# Patient Record
Sex: Male | Born: 1995 | Race: White | Hispanic: No | Marital: Single | Smoking: Never smoker
Health system: Southern US, Community
[De-identification: ages and names within clinical notes are randomized; demographics above are authoritative.]

## PROBLEM LIST (undated history)

## (undated) DIAGNOSIS — Z801 Family history of malignant neoplasm of trachea, bronchus and lung: Secondary | ICD-10-CM

## (undated) DIAGNOSIS — L732 Hidradenitis suppurativa: Secondary | ICD-10-CM

## (undated) DIAGNOSIS — F329 Major depressive disorder, single episode, unspecified: Secondary | ICD-10-CM

## (undated) DIAGNOSIS — Z8042 Family history of malignant neoplasm of prostate: Secondary | ICD-10-CM

## (undated) DIAGNOSIS — Z8041 Family history of malignant neoplasm of ovary: Secondary | ICD-10-CM

## (undated) DIAGNOSIS — Z800 Family history of malignant neoplasm of digestive organs: Secondary | ICD-10-CM

## (undated) DIAGNOSIS — F32A Depression, unspecified: Secondary | ICD-10-CM

## (undated) DIAGNOSIS — Z803 Family history of malignant neoplasm of breast: Secondary | ICD-10-CM

## (undated) DIAGNOSIS — B019 Varicella without complication: Secondary | ICD-10-CM

## (undated) HISTORY — DX: Major depressive disorder, single episode, unspecified: F32.9

## (undated) HISTORY — DX: Family history of malignant neoplasm of digestive organs: Z80.0

## (undated) HISTORY — DX: Family history of malignant neoplasm of ovary: Z80.41

## (undated) HISTORY — DX: Depression, unspecified: F32.A

## (undated) HISTORY — DX: Varicella without complication: B01.9

## (undated) HISTORY — DX: Hidradenitis suppurativa: L73.2

## (undated) HISTORY — DX: Family history of malignant neoplasm of trachea, bronchus and lung: Z80.1

## (undated) HISTORY — DX: Family history of malignant neoplasm of breast: Z80.3

## (undated) HISTORY — DX: Family history of malignant neoplasm of prostate: Z80.42

---
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
## 2015-01-03 ENCOUNTER — Ambulatory Visit (INDEPENDENT_AMBULATORY_CARE_PROVIDER_SITE_OTHER): Payer: Self-pay | Admitting: Family Medicine

## 2015-01-03 ENCOUNTER — Encounter: Payer: Self-pay | Admitting: Family Medicine

## 2015-01-03 VITALS — BP 108/58 | HR 69 | Temp 98.9°F | Ht 72.0 in | Wt 301.0 lb

## 2015-01-03 DIAGNOSIS — H109 Unspecified conjunctivitis: Secondary | ICD-10-CM

## 2015-01-03 DIAGNOSIS — R739 Hyperglycemia, unspecified: Secondary | ICD-10-CM

## 2015-01-03 DIAGNOSIS — R42 Dizziness and giddiness: Secondary | ICD-10-CM

## 2015-01-03 LAB — CBC WITH DIFFERENTIAL/PLATELET
BASOS PCT: 0.2 % (ref 0.0–3.0)
Basophils Absolute: 0 10*3/uL (ref 0.0–0.1)
Eosinophils Absolute: 0.1 10*3/uL (ref 0.0–0.7)
Eosinophils Relative: 1.1 % (ref 0.0–5.0)
HEMATOCRIT: 46.3 % (ref 36.0–49.0)
HEMOGLOBIN: 15.8 g/dL (ref 12.0–16.0)
LYMPHS PCT: 24.5 % (ref 24.0–48.0)
Lymphs Abs: 2.7 10*3/uL (ref 0.7–4.0)
MCHC: 34.1 g/dL (ref 31.0–37.0)
MCV: 86.9 fl (ref 78.0–98.0)
MONOS PCT: 6.9 % (ref 3.0–12.0)
Monocytes Absolute: 0.8 10*3/uL (ref 0.1–1.0)
NEUTROS ABS: 7.5 10*3/uL (ref 1.4–7.7)
Neutrophils Relative %: 67.3 % (ref 43.0–71.0)
Platelets: 235 10*3/uL (ref 150.0–575.0)
RBC: 5.33 Mil/uL (ref 3.80–5.70)
RDW: 13.4 % (ref 11.4–15.5)
WBC: 11.1 10*3/uL (ref 4.5–13.5)

## 2015-01-03 LAB — BASIC METABOLIC PANEL
BUN: 13 mg/dL (ref 6–23)
CHLORIDE: 107 meq/L (ref 96–112)
CO2: 28 mEq/L (ref 19–32)
CREATININE: 0.89 mg/dL (ref 0.40–1.50)
Calcium: 9.3 mg/dL (ref 8.4–10.5)
GFR: 116 mL/min (ref >60.00)
GLUCOSE: 74 mg/dL (ref 70–99)
POTASSIUM: 4.3 meq/L (ref 3.5–5.1)
SODIUM: 140 meq/L (ref 135–145)

## 2015-01-03 LAB — HEPATIC FUNCTION PANEL
ALK PHOS: 81 U/L (ref 52–171)
ALT: 25 U/L (ref 0–53)
AST: 17 U/L (ref 0–37)
Albumin: 4 g/dL (ref 3.5–5.2)
BILIRUBIN DIRECT: 0.1 mg/dL (ref 0.0–0.3)
BILIRUBIN TOTAL: 0.5 mg/dL (ref 0.2–1.2)
Total Protein: 6.7 g/dL (ref 6.0–8.3)

## 2015-01-03 LAB — TSH: TSH: 1.78 u[IU]/mL (ref 0.40–5.00)

## 2015-01-03 LAB — HEMOGLOBIN A1C: Hgb A1c MFr Bld: 5.5 % (ref 4.6–6.5)

## 2015-01-03 NOTE — Progress Notes (Signed)
   Subjective:    Patient ID: Ricardo [name], male    DOB: 1996/04/25, 19 y.o.   MRN: [id number]  HPI 19 yr old male to re-establish and for several issues. First he had a spell at home 4 days ago when he suddenly felt lightheaded, sweaty, weak, and nauseated. No chest pain or SOB. He sat down and ate some food, and within 5 minutes he felt much better. He has had no spells like this ever since. Also he describes several weeks of irritation and burning in both eyes. No DC. No blurred vision. He does not wear contacts.    Review of Systems  Constitutional: Positive for diaphoresis. Negative for fever and fatigue.  Eyes: Positive for redness and itching. Negative for photophobia, discharge and visual disturbance.  Respiratory: Negative.   Cardiovascular: Negative.   Neurological: Positive for weakness.       Objective:   Physical Exam  Constitutional: He is oriented to person, place, and time. He appears well-developed and well-nourished.  HENT:  Right Ear: External ear normal.  Left Ear: External ear normal.  Nose: Nose normal.  Mouth/Throat: Oropharynx is clear and moist.  Eyes: Conjunctivae and EOM are normal. Pupils are equal, round, and reactive to light. Right eye exhibits no discharge. Left eye exhibits no discharge.  Cardiovascular: Normal rate, regular rhythm, normal heart sounds and intact distal pulses.   Pulmonary/Chest: Effort normal and breath sounds normal.  Neurological: He is alert and oriented to person, place, and time. He has normal reflexes. No cranial nerve deficit. He exhibits normal muscle tone. Coordination normal.          Assessment & Plan:  He is having some allergy issues in the eyes. He ill try Artificial Tears prn. Also he had a spell which sounds like hypoglycemia. We will screen with labs today. He is advised to eat healthy snacks throughout the day and to avoid skipping meals.

## 2015-01-03 NOTE — Progress Notes (Signed)
Pre visit review using our clinic review tool, if applicable. No additional management support is needed unless otherwise documented below in the visit note. 

## 2015-11-17 ENCOUNTER — Telehealth: Payer: Self-pay | Admitting: Family Medicine

## 2015-11-17 ENCOUNTER — Ambulatory Visit: Payer: Self-pay | Admitting: Family Medicine

## 2015-11-17 NOTE — Telephone Encounter (Signed)
Pt was on schedule to see Dr. Sarajane Jews, tried to reach pt by phone and no answer.

## 2016-01-10 ENCOUNTER — Encounter: Payer: Self-pay | Admitting: Family Medicine

## 2016-01-10 ENCOUNTER — Ambulatory Visit (INDEPENDENT_AMBULATORY_CARE_PROVIDER_SITE_OTHER): Payer: Self-pay | Admitting: Family Medicine

## 2016-01-10 VITALS — BP 112/68 | HR 98 | Temp 98.7°F | Ht 72.0 in | Wt 327.0 lb

## 2016-01-10 DIAGNOSIS — L732 Hidradenitis suppurativa: Secondary | ICD-10-CM

## 2016-01-10 DIAGNOSIS — K625 Hemorrhage of anus and rectum: Secondary | ICD-10-CM

## 2016-01-10 DIAGNOSIS — R1013 Epigastric pain: Secondary | ICD-10-CM

## 2016-01-10 MED ORDER — OMEPRAZOLE 40 MG PO CPDR: 40.0000 mg | DELAYED_RELEASE_CAPSULE | Freq: Every day | ORAL | Status: DC

## 2016-01-10 MED ORDER — DOXYCYCLINE HYCLATE 100 MG PO CAPS: 100.0000 mg | ORAL_CAPSULE | Freq: Two times a day (BID) | ORAL | Status: AC

## 2016-01-10 NOTE — Progress Notes (Signed)
Pre visit review using our clinic review tool, if applicable. No additional management support is needed unless otherwise documented below in the visit note. 

## 2016-01-10 NOTE — Progress Notes (Signed)
   Subjective:    Patient ID: Ricardo [name], male    DOB: 1996-10-20, 20 y.o.   MRN: [id number]  HPI Here for several issues. 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 No fevers. These open and drain and they are painful. Also he has had 4 episodes of bright red blood in the stool with a BM over the past 4 weeks. These stools are soft and are easy to pass. His stools are never painful. Also he has had an intermittent dull aching pain in the epigastrium for the past few weeks. No heartburn. No nausea. His appetite is normal.    Review of Systems  Constitutional: Negative.   Respiratory: Negative.   Cardiovascular: Negative.   Gastrointestinal: Positive for abdominal pain and blood in stool. Negative for nausea, vomiting, diarrhea, constipation, abdominal distention and rectal pain.  Skin: Positive for wound.       Objective:   Physical Exam  Constitutional: He appears well-developed and well-nourished.  Cardiovascular: Normal rate, regular rhythm, normal heart sounds and intact distal pulses.   Pulmonary/Chest: Effort normal and breath sounds normal.  Abdominal: Soft. Bowel sounds are normal. He exhibits no distension and no mass. There is no tenderness. There is no rebound and no guarding.  Genitourinary: Rectum normal.  No evidence of hemorrhoids or fissures           Assessment & Plan:  Treat the hydradenitis with Doxycycline for 30 days. He may have a duodenal ulcer, given Omeprazole to try. The source of rectal bleeding is unclear. We will refer him to GI.

## 2016-09-17 ENCOUNTER — Ambulatory Visit (INDEPENDENT_AMBULATORY_CARE_PROVIDER_SITE_OTHER): Payer: Self-pay | Admitting: Family Medicine

## 2016-09-17 ENCOUNTER — Encounter: Payer: Self-pay | Admitting: Family Medicine

## 2016-09-17 VITALS — BP 118/70 | HR 76 | Temp 98.7°F | Resp 16 | Ht 72.0 in | Wt 309.0 lb

## 2016-09-17 DIAGNOSIS — H109 Unspecified conjunctivitis: Secondary | ICD-10-CM

## 2016-09-17 MED ORDER — POLYMYXIN B-TRIMETHOPRIM 10000-0.1 UNIT/ML-% OP SOLN: 1.0000 [drp] | Freq: Four times a day (QID) | OPHTHALMIC | 0 refills | Status: DC

## 2016-09-17 NOTE — Progress Notes (Signed)
Subjective:    Patient ID: Ricardo [name], male    DOB: 1996-05-14, 20 y.o.   MRN: [id number]  HPI  Mr. Thole is a 20 year old male who presents today with right eye tearing and redness that started one week ago.  Associated tearing and  crusting is present in the morning and occurs throughout the day. Crusting is noted as yellow/brown in color.  No history of sick contact or recent antibiotic use. He wears glasses and does not wear contacts. He denies fever, chills, sweats, or vision changes. Treatment at home includes OTC redness relief drops that has provided limited benefit. No aggravating or alleviating factors are noted.  Review of Systems  Constitutional: Negative for chills, fatigue and fever.  HENT: Negative for congestion, postnasal drip, rhinorrhea, sinus pressure, sneezing and sore throat.   Eyes: Positive for discharge and redness. Negative for visual disturbance.  Respiratory: Negative for cough and wheezing.   Cardiovascular: Negative for chest pain and palpitations.  Gastrointestinal: Negative for abdominal pain, constipation, diarrhea, nausea and vomiting.   Past Medical History:  Diagnosis Date  . Chicken pox   . Depression   . Hydradenitis    suppurativa     Social History   Social History  . Marital status: Single    Spouse name: N/A  . Number of children: N/A  . Years of education: N/A   Occupational History  . Not on file.   Social History Main Topics  . Smoking status: Never Smoker  . Smokeless tobacco: Never Used  . Alcohol use No  . Drug use:     Types: Marijuana     Comment: every day  . Sexual activity: No   Other Topics Concern  . Not on file   Social History Narrative  . No narrative on file    History reviewed. No pertinent surgical history.  History reviewed. No pertinent family history.  Allergies  Allergen Reactions  . Sulfonamide Derivatives     Current Outpatient Prescriptions on File Prior to Visit  Medication  Sig Dispense Refill  . omeprazole (PRILOSEC) 40 MG capsule Take 1 capsule (40 mg total) by mouth daily. 30 capsule 5   No current facility-administered medications on file prior to visit.     BP 118/70 (BP Location: Left Arm, Patient Position: Sitting, Cuff Size: Normal)   Pulse 76   Temp 98.7 F (37.1 C) (Oral)   Resp 16   Ht 6' (1.829 m)   Wt (!) 309 lb (140.2 kg)   SpO2 98%   BMI 41.91 kg/m        Objective:   Physical Exam  Constitutional: He appears well-developed and well-nourished.  HENT:  Right Ear: Tympanic membrane normal.  Left Ear: Tympanic membrane normal.  Nose: No rhinorrhea. Right sinus exhibits no maxillary sinus tenderness and no frontal sinus tenderness. Left sinus exhibits no maxillary sinus tenderness and no frontal sinus tenderness.  Mouth/Throat: Mucous membranes are normal. No oropharyngeal exudate or posterior oropharyngeal erythema.  Eyes: Pupils are equal, round, and reactive to light. Left eye exhibits discharge. No scleral icterus.  Diffuse conjunctival redness noted in left eye.  No visual changes present. Patient wears glasses and does not wear contact lenses. No evidence of foreign body present, hordeolum,or blepharitis present.  Cardiovascular: Normal rate and regular rhythm.   Pulmonary/Chest: Effort normal and breath sounds normal. He has no wheezes. He has no rales.        Assessment & Plan:  1. Bacterial  conjunctivitis of left eye Exam and history support treatment for bacterial conjunctivitis.  Polytrim provided and advised patient to use a cool, clean washcloth for 10-20 minutes TID. Advised proper handwashing discussed that this can can be spread so avoid sharing towels/washcloths. - trimethoprim-polymyxin b (POLYTRIM) ophthalmic solution; Place 1 drop into the left eye every 6 (six) hours.  Dispense: 10 mL; Refill: 0  Delano Metz, FNP-C

## 2016-09-17 NOTE — Patient Instructions (Signed)
Please use drops as directed and follow up if symptoms do not improve, suddenly worsen, or you develop changes in your visit. See information below.   Bacterial Conjunctivitis Bacterial conjunctivitis, commonly called pink eye, is an inflammation of the clear membrane that covers the white part of the eye (conjunctiva). The inflammation can also happen on the underside of the eyelids. The blood vessels in the conjunctiva become inflamed, causing the eye to become red or pink. Bacterial conjunctivitis may spread easily from one eye to another and from person to person (contagious).  CAUSES  Bacterial conjunctivitis is caused by bacteria. The bacteria may come from your own skin, your upper respiratory tract, or from someone else with bacterial conjunctivitis. SYMPTOMS  The normally white color of the eye or the underside of the eyelid is usually pink or red. The pink eye is usually associated with irritation, tearing, and some sensitivity to light. Bacterial conjunctivitis is often associated with a thick, yellowish discharge from the eye. The discharge may turn into a crust on the eyelids overnight, which causes your eyelids to stick together. If a discharge is present, there may also be some blurred vision in the affected eye. DIAGNOSIS  Bacterial conjunctivitis is diagnosed by your caregiver through an eye exam and the symptoms that you report. Your caregiver looks for changes in the surface tissues of your eyes, which may point to the specific type of conjunctivitis. A sample of any discharge may be collected on a cotton-tip swab if you have a severe case of conjunctivitis, if your cornea is affected, or if you keep getting repeat infections that do not respond to treatment. The sample will be sent to a lab to see if the inflammation is caused by a bacterial infection and to see if the infection will respond to antibiotic medicines. TREATMENT   Bacterial conjunctivitis is treated with antibiotics.  Antibiotic eyedrops are most often used. However, antibiotic ointments are also available. Antibiotics pills are sometimes used. Artificial tears or eye washes may ease discomfort. HOME CARE INSTRUCTIONS   To ease discomfort, apply a cool, clean washcloth to your eye for 10-20 minutes, 3-4 times a day.  Gently wipe away any drainage from your eye with a warm, wet washcloth or a cotton ball.  Wash your hands often with soap and water. Use paper towels to dry your hands.  Do not share towels or washcloths. This may spread the infection.  Change or wash your pillowcase every day.  You should not use eye makeup until the infection is gone.  Do not operate machinery or drive if your vision is blurred.  Stop using contact lenses. Ask your caregiver how to sterilize or replace your contacts before using them again. This depends on the type of contact lenses that you use.  When applying medicine to the infected eye, do not touch the edge of your eyelid with the eyedrop bottle or ointment tube. SEEK IMMEDIATE MEDICAL CARE IF:   Your infection has not improved within 3 days after beginning treatment.  You had yellow discharge from your eye and it returns.  You have increased eye pain.  Your eye redness is spreading.  Your vision becomes blurred.  You have a fever or persistent symptoms for more than 2-3 days.  You have a fever and your symptoms suddenly get worse.  You have facial pain, redness, or swelling. MAKE SURE YOU:   Understand these instructions.  Will watch your condition.  Will get help right away if you are  not doing well or get worse.   This information is not intended to replace advice given to you by your health care provider. Make sure you discuss any questions you have with your health care provider.   Document Released: 11/19/2005 Document Revised: 12/10/2014 Document Reviewed: 04/21/2012 Elsevier Interactive Patient Education [employer].

## 2016-11-14 ENCOUNTER — Other Ambulatory Visit: Payer: Self-pay | Admitting: Orthopedic Surgery

## 2016-11-14 DIAGNOSIS — M545 Low back pain: Secondary | ICD-10-CM

## 2016-11-14 DIAGNOSIS — M5416 Radiculopathy, lumbar region: Secondary | ICD-10-CM

## 2016-11-30 ENCOUNTER — Ambulatory Visit
Admission: RE | Admit: 2016-11-30 | Discharge: 2016-11-30 | Disposition: A | Discharge status: Home / Self Care | Payer: No Typology Code available for payment source | Source: Ambulatory Visit | Attending: Orthopedic Surgery | Admitting: Orthopedic Surgery

## 2016-11-30 DIAGNOSIS — M545 Low back pain: Secondary | ICD-10-CM

## 2016-11-30 DIAGNOSIS — M5416 Radiculopathy, lumbar region: Secondary | ICD-10-CM

## 2016-12-14 ENCOUNTER — Other Ambulatory Visit: Payer: Self-pay | Admitting: Physical Medicine and Rehabilitation

## 2016-12-14 DIAGNOSIS — M545 Low back pain: Secondary | ICD-10-CM

## 2016-12-14 DIAGNOSIS — M79605 Pain in left leg: Secondary | ICD-10-CM

## 2016-12-24 ENCOUNTER — Ambulatory Visit
Admission: RE | Admit: 2016-12-24 | Discharge: 2016-12-24 | Disposition: A | Discharge status: Home / Self Care | Payer: No Typology Code available for payment source | Source: Ambulatory Visit | Attending: Physical Medicine and Rehabilitation | Admitting: Physical Medicine and Rehabilitation

## 2016-12-24 DIAGNOSIS — M79605 Pain in left leg: Secondary | ICD-10-CM

## 2016-12-24 DIAGNOSIS — M545 Low back pain: Secondary | ICD-10-CM

## 2016-12-24 MED ORDER — IOPAMIDOL (ISOVUE-M 200) INJECTION 41%
1.0000 mL | Freq: Once | INTRAMUSCULAR | Status: AC
  Administered 2016-12-24: 1 mL via EPIDURAL

## 2016-12-24 MED ORDER — METHYLPREDNISOLONE ACETATE 40 MG/ML INJ SUSP (RADIOLOG
120.0000 mg | Freq: Once | INTRAMUSCULAR | Status: AC
  Administered 2016-12-24: 120 mg via EPIDURAL

## 2016-12-24 NOTE — Discharge Instructions (Signed)

## 2017-02-05 ENCOUNTER — Other Ambulatory Visit: Payer: Self-pay | Admitting: Family Medicine

## 2017-02-05 DIAGNOSIS — H109 Unspecified conjunctivitis: Secondary | ICD-10-CM

## 2017-05-31 ENCOUNTER — Encounter: Payer: Self-pay | Admitting: Family Medicine

## 2017-05-31 ENCOUNTER — Ambulatory Visit (INDEPENDENT_AMBULATORY_CARE_PROVIDER_SITE_OTHER): Payer: Self-pay | Admitting: Family Medicine

## 2017-05-31 VITALS — BP 110/58 | HR 51 | Temp 97.5°F | Ht 72.0 in | Wt 302.2 lb

## 2017-05-31 DIAGNOSIS — H6122 Impacted cerumen, left ear: Secondary | ICD-10-CM

## 2017-05-31 NOTE — Progress Notes (Signed)
  HPI:  Acute visit for "clogged ear": -started a few days ago -clogged sensation L ear, decreased hearing and occasional popping sound -denies pain, fevers, malaise, sinus congestion  ROS: See pertinent positives and negatives per HPI.  Past Medical History:  Diagnosis Date  . Chicken pox   . Depression   . Hydradenitis    suppurativa    No past surgical history on file.  No family history on file.  Social History   Social History  . Marital status: Single    Spouse name: N/A  . Number of children: N/A  . Years of education: N/A   Social History Main Topics  . Smoking status: Never Smoker  . Smokeless tobacco: Never Used  . Alcohol use No  . Drug use: Yes    Types: Marijuana     Comment: every day  . Sexual activity: No   Other Topics Concern  . None   Social History Narrative  . None    No current outpatient prescriptions on file.  EXAM:  Vitals:   05/31/17 1625  BP: (!) 110/58  Pulse: (!) 51  Temp: 97.5 F (36.4 C)    Body mass index is 40.99 kg/m.  GENERAL: vitals reviewed and listed above, alert, oriented, appears well hydrated and in no acute distress  HEENT: atraumatic, conjunttiva clear, no obvious abnormalities on inspection of external nose and ears, cerumen impaction with complete occlusion of ear canal L, erytheama scaling and plaques of skin on both ears  SKIN: scaly plaques with erythematous base bilat elbows  NECK: no obvious masses on inspection  LUNGS: clear to auscultation bilaterally, no wheezes, rales or rhonchi, good air movement  CV: HRRR, no peripheral edema  MS: moves all extremities without noticeable abnormality  PSYCH: pleasant and cooperative, no obvious depression or anxiety  ASSESSMENT AND PLAN:  Discussed the following assessment and plan:  Impacted cerumen of left ear -discussed options for management - he opted for manual removal - large amount of cerumen removed from L ear canal with soft curette - some  impacted wax remained on TM and he opted to try home ear cleaning drops for this and follow up for recheck in 1 week -he also reports extensive psoriasis and agrees to discuss with PCP on follow up -Patient advised to recheck sooner if symptoms worsen or new concerns arise.  Patient Instructions  BEFORE YOU LEAVE: -follow up: in 1 week with Dr. Sarajane Jews  Ear cleaning drops once daily - debrox is one options  Follow up sooner if pain, worsening or new symptoms    Whittley Carandang, Jarrett Soho R., DO

## 2017-05-31 NOTE — Patient Instructions (Signed)
BEFORE YOU LEAVE: -follow up: in 1 week with Dr. Sarajane Jews  Ear cleaning drops once daily - debrox is one options  Follow up sooner if pain, worsening or new symptoms

## 2017-06-07 ENCOUNTER — Encounter: Payer: Self-pay | Admitting: Family Medicine

## 2017-06-07 ENCOUNTER — Ambulatory Visit (INDEPENDENT_AMBULATORY_CARE_PROVIDER_SITE_OTHER): Payer: Self-pay | Admitting: Family Medicine

## 2017-06-07 VITALS — BP 110/74 | Temp 98.2°F | Ht 72.0 in | Wt 305.0 lb

## 2017-06-07 DIAGNOSIS — L400 Psoriasis vulgaris: Secondary | ICD-10-CM

## 2017-06-07 DIAGNOSIS — H6122 Impacted cerumen, left ear: Secondary | ICD-10-CM

## 2017-06-07 NOTE — Patient Instructions (Signed)
WE NOW OFFER   Ricardo [name] FAST TRACK!!!  SAME DAY Appointments for ACUTE CARE  Such as: Sprains, Injuries, cuts, abrasions, rashes, muscle pain, joint pain, back pain Colds, flu, sore throats, headache, allergies, cough, fever  Ear pain, sinus and eye infections Abdominal pain, nausea, vomiting, diarrhea, upset stomach Animal/insect bites  3 Easy Ways to Schedule: Walk-In Scheduling Call in scheduling Mychart Sign-up: [email or web address]         

## 2017-06-07 NOTE — Progress Notes (Signed)
   Subjective:    Patient ID: Ricardo [name], male    DOB: 1996-11-04, 21 y.o.   MRN: [id number]  HPI Here for 2 issues. First he has had decreased hearing and pressure in the left ear for several weeks. No pain. He was seen here last week and some cerumen was removed with a curette. Also his psoriasis is getting worse and topical preparations like Clobetasol no longer help.     Review of Systems  Constitutional: Negative.   HENT: Positive for hearing loss. Negative for ear discharge and ear pain.   Respiratory: Negative.   Cardiovascular: Negative.   Musculoskeletal: Negative for arthralgias.  Skin: Positive for rash.       Objective:   Physical Exam  Constitutional: He appears well-developed and well-nourished.  HENT:  Nose: Nose normal.  Mouth/Throat: Oropharynx is clear and moist.  The right ear canal is partially full of cerumen. The left ear canal is completely blocked with cerumen.   Eyes: Conjunctivae are normal.  Neck: No thyromegaly present.  Cardiovascular: Normal rate, regular rhythm, normal heart sounds and intact distal pulses.   Pulmonary/Chest: Effort normal and breath sounds normal.  Lymphadenopathy:    He has no cervical adenopathy.  Skin:  He has extensive psoriatic plaques in both ear lobes, on both elbows, and on both knees.           Assessment & Plan:  The cerumen impactions were irrigated clear with water. We will refer him to Dermatology for the psoriasis.  Alysia Penna, MD

## 2017-06-11 ENCOUNTER — Encounter: Payer: Self-pay | Admitting: Adult Health

## 2017-06-11 ENCOUNTER — Ambulatory Visit (INDEPENDENT_AMBULATORY_CARE_PROVIDER_SITE_OTHER): Payer: Self-pay | Admitting: Adult Health

## 2017-06-11 VITALS — BP 104/60 | HR 88 | Temp 98.3°F | Ht 72.0 in | Wt 305.8 lb

## 2017-06-11 DIAGNOSIS — L400 Psoriasis vulgaris: Secondary | ICD-10-CM

## 2017-06-11 NOTE — Progress Notes (Signed)
   Subjective:    Patient ID: MIGUELANGEL KORN, male    DOB: 04-Oct-1996, 21 y.o.   MRN: [id number]  HPI  21 year old male who  has a past medical history of Chicken pox; Depression; and Hydradenitis.  He presents to the office today for left ear pain. He recently had his cerumen impaction irrigated by his PCP. The day after he noticed pain in the ear canal and clear drainage. Per patient " I feel like it may be infected."   He denies any fevers, inner ear pain, or feeling ill.   Review of Systems See HPI   Past Medical History:  Diagnosis Date  . Chicken pox   . Depression   . Hydradenitis    suppurativa    Social History   Social History  . Marital status: Single    Spouse name: N/A  . Number of children: N/A  . Years of education: N/A   Occupational History  . Not on file.   Social History Main Topics  . Smoking status: Never Smoker  . Smokeless tobacco: Never Used  . Alcohol use No  . Drug use: Yes    Types: Marijuana     Comment: every day  . Sexual activity: No   Other Topics Concern  . Not on file   Social History Narrative  . No narrative on file    No past surgical history on file.  No family history on file.  Allergies  Allergen Reactions  . Sulfonamide Derivatives     No current outpatient prescriptions on file prior to visit.   No current facility-administered medications on file prior to visit.     BP 104/60   Pulse 88   Temp 98.3 F (36.8 C) (Oral)   Ht 6' (1.829 m)   Wt (!) 305 lb 12.8 oz (138.7 kg)   BMI 41.47 kg/m       Objective:   Physical Exam  Constitutional: He is oriented to person, place, and time. He appears well-developed and well-nourished. No distress.  HENT:  Head: Normocephalic and atraumatic.  Nose: Nose normal.  Mouth/Throat: Oropharynx is clear and moist. No oropharyngeal exudate.  Neurological: He is alert and oriented to person, place, and time.  Skin: Skin is warm and dry. Rash (psorias ) noted. He  is not diaphoretic.  Nursing note and vitals reviewed.     Assessment & Plan:  1. Plaque psoriasis - Advised lotion and/or cortisone cream to the area until seen by dermatology  - No signs of infection  - Follow up with PCP as needed  Dorothyann Peng, NP

## 2017-07-23 ENCOUNTER — Ambulatory Visit (INDEPENDENT_AMBULATORY_CARE_PROVIDER_SITE_OTHER): Payer: Self-pay | Admitting: Family Medicine

## 2017-07-23 ENCOUNTER — Encounter: Payer: Self-pay | Admitting: Family Medicine

## 2017-07-23 VITALS — BP 126/70 | Temp 98.1°F | Ht 72.0 in | Wt 321.0 lb

## 2017-07-23 DIAGNOSIS — H1033 Unspecified acute conjunctivitis, bilateral: Secondary | ICD-10-CM

## 2017-07-23 MED ORDER — TOBRAMYCIN-DEXAMETHASONE 0.3-0.1 % OP SUSP: 2.0000 [drp] | OPHTHALMIC | 0 refills | Status: DC

## 2017-07-23 NOTE — Progress Notes (Signed)
   Subjective:    Patient ID: Ricardo [name], male    DOB: 05/02/1996, 21 y.o.   MRN: [id number]  HPI  here for 2 days of irritation and mucus in both eyes. No URI symptoms. He does not wear contacts.    Review of Systems  Constitutional: Negative.   HENT: Negative for congestion, ear pain, facial swelling, postnasal drip, rhinorrhea, sinus pain and sinus pressure.   Eyes: Positive for discharge, redness and itching. Negative for visual disturbance.  Respiratory: Negative.        Objective:   Physical Exam  Constitutional: He appears well-developed and well-nourished.  HENT:  Right Ear: External ear normal.  Left Ear: External ear normal.  Nose: Nose normal.  Mouth/Throat: Oropharynx is clear and moist.  Eyes: Pupils are equal, round, and reactive to light.  Both conjunctivae are pink, the left eye has some yellow DC in the corners  Neck: Neck supple. No thyromegaly present.  Pulmonary/Chest: Effort normal and breath sounds normal. No respiratory distress. He has no wheezes. He has no rales.  Lymphadenopathy:    He has no cervical adenopathy.          Assessment & Plan:  Conjunctivitis, treat with warm compresses and Tobradex drops.  Alysia Penna, MD

## 2017-07-23 NOTE — Patient Instructions (Signed)
WE NOW OFFER   [facility] FAST TRACK!!!  SAME DAY Appointments for ACUTE CARE  Such as: Sprains, Injuries, cuts, abrasions, rashes, muscle pain, joint pain, back pain Colds, flu, sore throats, headache, allergies, cough, fever  Ear pain, sinus and eye infections Abdominal pain, nausea, vomiting, diarrhea, upset stomach Animal/insect bites  3 Easy Ways to Schedule: Walk-In Scheduling Call in scheduling Mychart Sign-up: [email or web address]         

## 2017-08-12 ENCOUNTER — Ambulatory Visit (INDEPENDENT_AMBULATORY_CARE_PROVIDER_SITE_OTHER): Payer: Self-pay | Admitting: Family Medicine

## 2017-08-12 ENCOUNTER — Encounter: Payer: Self-pay | Admitting: Family Medicine

## 2017-08-12 VITALS — HR 75 | Temp 98.2°F | Ht 72.0 in | Wt 328.0 lb

## 2017-08-12 DIAGNOSIS — F418 Other specified anxiety disorders: Secondary | ICD-10-CM | POA: Insufficient documentation

## 2017-08-12 MED ORDER — FLUOXETINE HCL 20 MG PO TABS: 20.0000 mg | ORAL_TABLET | Freq: Every day | ORAL | 3 refills | Status: DC

## 2017-08-12 NOTE — Patient Instructions (Signed)
WE NOW OFFER   [facility] FAST TRACK!!!  SAME DAY Appointments for ACUTE CARE  Such as: Sprains, Injuries, cuts, abrasions, rashes, muscle pain, joint pain, back pain Colds, flu, sore throats, headache, allergies, cough, fever  Ear pain, sinus and eye infections Abdominal pain, nausea, vomiting, diarrhea, upset stomach Animal/insect bites  3 Easy Ways to Schedule: Walk-In Scheduling Call in scheduling Mychart Sign-up: [email or web address]         

## 2017-08-12 NOTE — Progress Notes (Signed)
   Subjective:    Patient ID: Ricardo [name], male    DOB: July 13, 1996, 21 y.o.   MRN: [id number]  HPI Here to discuss depression and anxiety. He has struggled with this off and on for years. He briefly took Prozac and did well as a teenager. Now for the past month he has felt sad, powerless, angry, anxious, and has had some suicidal thoughts. He has no plan however and he does not intend to hurt himself in any way. Is appetite is down and he sleeps more than usual. He used to enjoy swimming for exercise 3 days a week but lately he is too tired to go.    Review of Systems  Constitutional: Negative.   Respiratory: Negative.   Cardiovascular: Negative.   Neurological: Negative.   Psychiatric/Behavioral: Positive for decreased concentration, dysphoric mood, sleep disturbance and suicidal ideas. Negative for agitation, behavioral problems, confusion, hallucinations and self-injury. The patient is nervous/anxious. The patient is not hyperactive.        Objective:   Physical Exam  Constitutional: He is oriented to person, place, and time. He appears well-developed and well-nourished.  Cardiovascular: Normal rate, regular rhythm, normal heart sounds and intact distal pulses.   Pulmonary/Chest: Effort normal and breath sounds normal. No respiratory distress. He has no wheezes. He has no rales.  Musculoskeletal:  His affect is depressed, eye contact is poor   Lymphadenopathy:    He has no cervical adenopathy.  Neurological: He is alert and oriented to person, place, and time.          Assessment & Plan:  He has depression with anxiety, so we will treat with Prozac 20 mg daily. I urged him to get back into swimming for exercise. Recheck in 3 weeks. We spent a total of 45 minutes face to face discussing the treatment of depressoin. Alysia Penna, MD

## 2017-11-15 ENCOUNTER — Other Ambulatory Visit: Payer: Self-pay

## 2017-11-15 MED ORDER — FLUOXETINE HCL 20 MG PO TABS
20.0000 mg | ORAL_TABLET | Freq: Every day | ORAL | 0 refills | Status: DC
Start: 2017-11-15 — End: 2018-01-31

## 2018-01-21 ENCOUNTER — Ambulatory Visit: Payer: Self-pay | Admitting: *Deleted

## 2018-01-21 NOTE — Telephone Encounter (Signed)
Pt reports "abscess" at left groin area. Noted 1 week ago, "has been growing."  States area at left side of groin "in crease,more towards buttocks."  Reports area is large "I can hold it in my hand." Red, warm, area is hard, 10/10 pain "to touch."  States small amt of bleeding this am but no open areas, "weeping." Denies fever, any other symptoms. States he has had smaller "abscesses" in past that he "took care of by draining them myself." Pt has H/O Hidradenitis Suppurativa. Pt also states he has an appt with a surgeon "about 9 days from now."  Reports this was arranged by his father; surgeon has not seen patient, pt does not know surgeons name. Directed to UC. Care advice given per protocol. Reason for Disposition . [1] Swelling is red AND [2] size > 2 inches (5.0 cm) (Exception: itchy area of skin)  Answer Assessment - Initial Assessment Questions 1. APPEARANCE of SWELLING: "What does it look like?" (e.g., lymph node, insect bite, mole)     Large (size of fist) "Can hold in hand." 2. SIZE: "How large is the swelling?" (inches, cm or compare to coins)     Can hold in hand 3. LOCATION: "Where is the swelling located?"     Left groin area, at crease, towards buttocks 4. ONSET: "When did the swelling start?"     1 week ago 5. PAIN: "Is it painful?" If so, ask: "How much?"     10/10 to touch 6. ITCH: "Does it itch?" If so, ask: "How much?"    No 7. CAUSE: "What do you think caused the swelling?"     Have had in past but not as large.  Treated himself by draining. 8. OTHER SYMPTOMS: "Do you have any other symptoms?" (e.g., fever)     Small amount of bleeding last night, no open areas, "weeping"  Protocols used: SKIN LUMP OR LOCALIZED SWELLING-A-AH

## 2018-01-23 ENCOUNTER — Other Ambulatory Visit: Payer: Self-pay | Admitting: Orthopedic Surgery

## 2018-01-23 DIAGNOSIS — M5416 Radiculopathy, lumbar region: Secondary | ICD-10-CM

## 2018-01-31 ENCOUNTER — Other Ambulatory Visit: Payer: Self-pay | Admitting: *Deleted

## 2018-01-31 MED ORDER — FLUOXETINE HCL 20 MG PO TABS: 20.0000 mg | ORAL_TABLET | Freq: Every day | ORAL | 0 refills | Status: DC

## 2018-01-31 NOTE — Telephone Encounter (Signed)
Rx done. 

## 2018-02-16 ENCOUNTER — Ambulatory Visit
Admission: RE | Admit: 2018-02-16 | Discharge: 2018-02-16 | Disposition: A | Discharge status: Home / Self Care | Payer: BLUE CROSS/BLUE SHIELD | Source: Ambulatory Visit | Attending: Orthopedic Surgery | Admitting: Orthopedic Surgery

## 2018-02-16 DIAGNOSIS — M5416 Radiculopathy, lumbar region: Secondary | ICD-10-CM

## 2018-03-10 ENCOUNTER — Ambulatory Visit: Payer: BLUE CROSS/BLUE SHIELD | Admitting: Family Medicine

## 2018-03-10 ENCOUNTER — Encounter: Payer: Self-pay | Admitting: Family Medicine

## 2018-03-10 VITALS — BP 110/80 | HR 58 | Temp 97.8°F | Ht 72.0 in | Wt 348.4 lb

## 2018-03-10 DIAGNOSIS — M79605 Pain in left leg: Secondary | ICD-10-CM | POA: Insufficient documentation

## 2018-03-10 DIAGNOSIS — M545 Low back pain, unspecified: Secondary | ICD-10-CM

## 2018-03-10 MED ORDER — FLUOXETINE HCL 20 MG PO TABS: 20.0000 mg | ORAL_TABLET | Freq: Every day | ORAL | 3 refills | Status: AC

## 2018-03-10 NOTE — Progress Notes (Signed)
   Subjective:    Patient ID: Ricardo [name], male    DOB: 1996-10-18, 22 y.o.   MRN: [id number]  HPI Here for advice about his spine. He has been dealing with low back pain and left sciatic pain for several years. He has been seeing Dr. Lynann Bologna and Dr. Mina Marble for this. 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 He went to PT then and received an epidural steroid shot. This was very successful and he was not bothered by pain for a good 6 months. Recently the pain has flared back up a bit however. He had another MRI on 02-16-18, and this was not very impressive. The mild disc bulges have not progressed at all in the past 18 months. Now [place] and his father are contemplating surgery, and they ask my opinion. [name] has a very sedentary lifestyle and he continues to slowly gain weight.   Review of Systems  Constitutional: Negative.   Respiratory: Negative.   Cardiovascular: Negative.   Musculoskeletal: Positive for back pain.       Objective:   Physical Exam  Constitutional: He is oriented to person, place, and time.  Morbidly obese   Cardiovascular: Normal rate, regular rhythm, normal heart sounds and intact distal pulses.  Pulmonary/Chest: Effort normal and breath sounds normal. No respiratory distress. He has no wheezes. He has no rales.  Musculoskeletal:  Mildly tender in the lower back on both sides, negative SLR, and full ROM   Neurological: He is alert and oriented to person, place, and time.          Assessment & Plan:  Low back pain. I told Maaz that I advised against a surgery at this time. There are risks to this procedure and there is no guarantee that it will help at all. My advice is to exercise, to strengthen his core, and to lose at least 100 lbs. Doing this would likely get rid of the pain altogether. He will discuss this with his family. We plan to see him back soon for a well exam and  labs. We spent 45 minutes together today. Alysia Penna, MD

## 2018-03-17 ENCOUNTER — Encounter: Payer: BLUE CROSS/BLUE SHIELD | Admitting: Family Medicine

## 2018-03-17 DIAGNOSIS — Z2089 Contact with and (suspected) exposure to other communicable diseases: Secondary | ICD-10-CM

## 2018-06-02 ENCOUNTER — Encounter: Payer: Self-pay | Admitting: Family Medicine

## 2018-06-02 ENCOUNTER — Ambulatory Visit: Payer: BLUE CROSS/BLUE SHIELD | Admitting: Family Medicine

## 2018-06-02 VITALS — BP 120/70 | HR 82 | Temp 98.4°F | Resp 12 | Ht 72.0 in | Wt 356.0 lb

## 2018-06-02 DIAGNOSIS — H9012 Conductive hearing loss, unilateral, left ear, with unrestricted hearing on the contralateral side: Secondary | ICD-10-CM | POA: Diagnosis not present

## 2018-06-02 DIAGNOSIS — H60503 Unspecified acute noninfective otitis externa, bilateral: Secondary | ICD-10-CM

## 2018-06-02 MED ORDER — CIPROFLOXACIN-DEXAMETHASONE 0.3-0.1 % OT SUSP: 4.0000 [drp] | Freq: Two times a day (BID) | OTIC | 0 refills | Status: DC

## 2018-06-02 NOTE — Patient Instructions (Signed)
  [name] I have seen you today for an acute visit.  A few things to remember from today's visit:   Acute otitis externa of both ears, unspecified type - Plan: ciprofloxacin-dexamethasone (CIPRODEX) OTIC suspension   Medications prescribed today are intended for short period of time and will not be refill upon request, a follow up appointment might be necessary to discuss continuation of of treatment if appropriate.     In general please monitor for signs of worsening symptoms and seek immediate medical attention if any concerning.  If symptoms are not resolved in 3-4 days you should schedule a follow up appointment with your doctor, before if needed.  I hope you get better soon!

## 2018-06-02 NOTE — Progress Notes (Signed)
ACUTE VISIT  HPI:  Chief Complaint  Patient presents with  . Otalgia    bilateral, started a few days ago, feels like lumps is blocking ears causing hearing loss    Ricardo [name] is a 22 y.o.male here today complaining of 2 to 3 days of constant bilateral ear ache and intermittent left hearing loss. Hearing loss left ear is alleviated by pulling up external ear.  Pain is moderate, is aggravated by palpation of ear canal. He has not identified alleviating factors for earache.  He has not noted fever, chills, ear drainage, or body aches. He denies recent URI or travel. Mild sore throat yesterday. No history of ear trauma.  He has used rinsed ears with hydrogen peroxide, which has not helped.  He has history of psoriasis. He uses Q-tips to apply psoriasic cream in the ears.   Otalgia   There is pain in both ears. This is a new problem. The current episode started in the past 7 days. The problem occurs constantly. The problem has been unchanged. There has been no fever. The pain is moderate. Associated symptoms include hearing loss and a sore throat (mild). Pertinent negatives include no abdominal pain, coughing, diarrhea, ear discharge, headaches, neck pain, rash, rhinorrhea or vomiting. The treatment provided no relief. His past medical history is significant for hearing loss. There is no history of a chronic ear infection.     Hx of allergies: No   Review of Systems  Constitutional: Negative for chills and fever.  HENT: Positive for ear pain, hearing loss and sore throat (mild). Negative for congestion, ear discharge, mouth sores, nosebleeds, rhinorrhea and trouble swallowing.   Respiratory: Negative for cough, shortness of breath and wheezing.   Cardiovascular: Negative for chest pain and leg swelling.  Gastrointestinal: Negative for abdominal pain, diarrhea, nausea and vomiting.  Musculoskeletal: Negative for myalgias and neck pain.  Skin: Negative for  rash.  Allergic/Immunologic: Negative for environmental allergies.  Neurological: Negative for headaches.  Hematological: Negative for adenopathy. Does not bruise/bleed easily.      Current Outpatient Medications on File Prior to Visit  Medication Sig Dispense Refill  . FLUoxetine (PROZAC) 20 MG tablet Take 1 tablet (20 mg total) by mouth daily. 90 tablet 3   No current facility-administered medications on file prior to visit.      Past Medical History:  Diagnosis Date  . Chicken pox   . Depression   . Hydradenitis    suppurativa   Allergies  Allergen Reactions  . Sulfonamide Derivatives     Social History   Socioeconomic History  . Marital status: Single    Spouse name: Not on file  . Number of children: Not on file  . Years of education: Not on file  . Highest education level: Not on file  Occupational History  . Not on file  Social Needs  . Financial resource strain: Not on file  . Food insecurity:    Worry: Not on file    Inability: Not on file  . Transportation needs:    Medical: Not on file    Non-medical: Not on file  Tobacco Use  . Smoking status: Never Smoker  . Smokeless tobacco: Never Used  Substance and Sexual Activity  . Alcohol use: No    Alcohol/week: 0.0 oz  . Drug use: Yes    Types: Marijuana    Comment: every day  . Sexual activity: Never  Lifestyle  . Physical activity:  Days per week: Not on file    Minutes per session: Not on file  . Stress: Not on file  Relationships  . Social connections:    Talks on phone: Not on file    Gets together: Not on file    Attends religious service: Not on file    Active member of club or organization: Not on file    Attends meetings of clubs or organizations: Not on file    Relationship status: Not on file  Other Topics Concern  . Not on file  Social History Narrative  . Not on file    Vitals:   06/02/18 1544  BP: 120/70  Pulse: 82  Resp: 12  Temp: 98.4 F (36.9 C)  SpO2: 96%    Body mass index is 48.28 kg/m.   Physical Exam  Nursing note and vitals reviewed. Constitutional: He is oriented to person, place, and time. He appears well-developed. He does not appear ill. No distress.  HENT:  Head: Normocephalic and atraumatic.  Right Ear: Tympanic membrane and ear canal normal. There is swelling. No tenderness. Tympanic membrane is not erythematous.  Left Ear: Tympanic membrane and ear canal normal. There is swelling and tenderness. Tympanic membrane is not erythematous.  Mouth/Throat: Uvula is midline and mucous membranes are normal. Posterior oropharyngeal erythema (mild) present. No oropharyngeal exudate or posterior oropharyngeal edema.  Eyes: Conjunctivae are normal.  Cardiovascular: Normal rate and regular rhythm.  No murmur heard. Respiratory: Effort normal and breath sounds normal. No respiratory distress.  Lymphadenopathy:       Head (right side): No submandibular, no preauricular and no posterior auricular adenopathy present.       Head (left side): No submandibular, no preauricular and no posterior auricular adenopathy present.    He has no cervical adenopathy.  Neurological: He is alert and oriented to person, place, and time. He has normal strength. Gait normal.  Skin: Skin is warm. Rash noted.  Erythematosus plaques on elbows, bilateral. Erythematosus scaly, macular rash on external aspect of the ears.  Psychiatric:  Flat affect, fairly groomed, good eye contact.      ASSESSMENT AND PLAN:   Ricardo [name] was seen today for otalgia.  Diagnoses and all orders for this visit:  Acute otitis externa of both ears, unspecified type -     ciprofloxacin-dexamethasone (CIPRODEX) OTIC suspension; Place 4 drops into both ears 2 (two) times daily for 7 days.  Conductive hearing loss of left ear, unspecified hearing status on contralateral side   Left ear hearing loss most likely related to edema. Topical antibiotic recommended. He was clearly  instructed about warning signs. Avoid hydrogen peroxide in the ears.  Follow-up in 3 to 4 days if he has not noted any improvement, before if needed.      Tonda Wiederhold G. Martinique, MD  [facility]. [place] office.

## 2018-06-03 ENCOUNTER — Telehealth: Payer: Self-pay | Admitting: Family Medicine

## 2018-06-03 ENCOUNTER — Other Ambulatory Visit: Payer: Self-pay | Admitting: Family Medicine

## 2018-06-03 MED ORDER — NEOMYCIN-POLYMYXIN-HC 3.5-10000-1 OT SOLN: 3.0000 [drp] | Freq: Four times a day (QID) | OTIC | 0 refills | Status: AC

## 2018-06-03 NOTE — Telephone Encounter (Signed)
Copied from [place] [id number]. Topic: Quick Communication - See Telephone Encounter >> Jun 03, 2018  3:56 PM Bea Graff, NT wrote: CRM for notification. See Telephone encounter for: 06/03/18. Pts mom calling and states that the ciprofloxacin-dexamethasone (CIPRODEX) OTIC suspension needs PA or if something else can be called in for her son for price efficiency and to get him some relief sooner. Send to [facility] on [address].

## 2018-06-03 NOTE — Telephone Encounter (Signed)
New Rx for ear drops sent, cortisporin.  Thanks, BJ

## 2018-06-03 NOTE — Telephone Encounter (Signed)
Dr Martinique, the Rx for ear drops prescribed during 06/02/18 visit is requiring PA Is there another Rx you wish to try?

## 2018-06-04 NOTE — Telephone Encounter (Signed)
Pt Mother made aware of new Rx Advised to let us know if there are any issues with insurance.  Nothing further needed.

## 2018-06-11 ENCOUNTER — Inpatient Hospital Stay: Payer: BLUE CROSS/BLUE SHIELD | Attending: Genetic Counselor | Admitting: Genetics

## 2018-06-11 ENCOUNTER — Inpatient Hospital Stay: Payer: BLUE CROSS/BLUE SHIELD

## 2018-06-11 DIAGNOSIS — Z1379 Encounter for other screening for genetic and chromosomal anomalies: Secondary | ICD-10-CM

## 2018-06-11 DIAGNOSIS — Z8041 Family history of malignant neoplasm of ovary: Secondary | ICD-10-CM

## 2018-06-11 DIAGNOSIS — Z801 Family history of malignant neoplasm of trachea, bronchus and lung: Secondary | ICD-10-CM

## 2018-06-11 DIAGNOSIS — Z803 Family history of malignant neoplasm of breast: Secondary | ICD-10-CM

## 2018-06-11 DIAGNOSIS — Z800 Family history of malignant neoplasm of digestive organs: Secondary | ICD-10-CM

## 2018-06-11 DIAGNOSIS — Z8042 Family history of malignant neoplasm of prostate: Secondary | ICD-10-CM

## 2018-06-12 ENCOUNTER — Encounter: Payer: Self-pay | Admitting: Genetics

## 2018-06-12 DIAGNOSIS — Z801 Family history of malignant neoplasm of trachea, bronchus and lung: Secondary | ICD-10-CM | POA: Insufficient documentation

## 2018-06-12 DIAGNOSIS — Z8041 Family history of malignant neoplasm of ovary: Secondary | ICD-10-CM | POA: Insufficient documentation

## 2018-06-12 DIAGNOSIS — Z800 Family history of malignant neoplasm of digestive organs: Secondary | ICD-10-CM | POA: Insufficient documentation

## 2018-06-12 DIAGNOSIS — Z8042 Family history of malignant neoplasm of prostate: Secondary | ICD-10-CM | POA: Insufficient documentation

## 2018-06-12 DIAGNOSIS — Z803 Family history of malignant neoplasm of breast: Secondary | ICD-10-CM | POA: Insufficient documentation

## 2018-06-12 NOTE — Progress Notes (Signed)
REFERRING PROVIDER: Self referral/ mother  PRIMARY PROVIDER:  Laurey Morale, MD  PRIMARY REASON FOR VISIT:  1. Family history of Lynch syndrome   2. Family history of prostate cancer   3. Family history of breast cancer   4. Family history of ovarian cancer   5. Family history of lung cancer     HISTORY OF PRESENT ILLNESS:   Ricardo [name], a 22 y.o. male, was seen for a [facility] cancer genetics consultation due to a family history of Lynch Syndrome identified in his mother recently.  Mr. Dennin presents to clinic today to discuss the possibility of a hereditary predisposition to cancer, genetic testing, and to further clarify his future cancer risks, as well as potential cancer risks for family members.   Mr. Opdahl is a 22 y.o. male with no personal history of cancer.   Colonoscopy: no; not examined.      Past Medical History:  Diagnosis Date  . Family history of breast cancer   . Family history of lung cancer   . Family history of Lynch syndrome   . Family history of ovarian cancer   . Family history of prostate cancer     No past surgical history on file.  Social History        Socioeconomic History  . Marital status: Single    Spouse name: Not on file  . Number of children: Not on file  . Years of education: Not on file  . Highest education level: Not on file  Occupational History  . Not on file  Social Needs  . Financial resource strain: Not on file  . Food insecurity:    Worry: Not on file    Inability: Not on file  . Transportation needs:    Medical: Not on file    Non-medical: Not on file  Tobacco Use  . Smoking status: Never Smoker  . Smokeless tobacco: Never Used  Substance and Sexual Activity  . Alcohol use: No    Alcohol/week: 0.0 oz  . Drug use: No  . Sexual activity: Not on file  Lifestyle  . Physical activity:    Days per week: Not on file    Minutes per session: Not on file  . Stress: Not on file   Relationships  . Social connections:    Talks on phone: Not on file    Gets together: Not on file    Attends religious service: Not on file    Active member of club or organization: Not on file    Attends meetings of clubs or organizations: Not on file    Relationship status: Not on file  Other Topics Concern  . Not on file  Social History Narrative  . Not on file     FAMILY HISTORY:  We obtained a detailed, 4-generation family history.  Significant diagnoses are listed below: Family History  Problem Relation Age of Onset  . Breast cancer Mother 24  . Other Mother        Lynch Syndrome- MSH6 mutation  . Prostate cancer Father 63       Gleason 7  . Non-Hodgkin's lymphoma Paternal Uncle   . Lung cancer Paternal Grandfather        died at 68  . Prostate cancer Other        both dx in 34's  . Ovarian cancer Other        dx 23's   Mr. Almon does not have any children.  He has  2 brothers ages 49 and 35.    Mr. Sulton's father: recently diagnosed with prostate cancer at 22, Gleason 7 Paternal Aunts/Uncles: 1 paternal aunt, 4 paternal uncles. 1 uncle has a hx of non-hodgkin's Lymphoma Paternal cousins: no history of cancer Paternal grandfather: lung cancer, died at 80.  His mother had lung cancer Paternal grandmother:80, no history of cancer. (7% Ashkenazi Jewish).  She has 2 brothers (pateint's great uncles) who had prostate cancer in their 59's.  (both survived their cancer, gleason unk). Her father had lung cancer.   Mr. Carbone's mother: recently diagnosed with breast cancer at 35.  She underwent genetic testing in June 2019 that revealed a Pathogenic variant in the gene MSH6 c.3226C>T (p.Arg1076Cys).  This is consistent with Lynch Syndrome.  Her genetic testing also revealed 2 variants of uncertain significance in the genes HRAS c.111+6C>T (Intronic) and MUTYH c.1256C>A (p.Ala419Asp).  Maternal Aunts/Uncles: 1 maternal aunt,unk Maternal cousins:  unk Maternal grandfather: no history of cancer.  He has a sister who had ovarian cancer in her 74;s, and another sister who had cancer (type unk) Maternal grandmother:died at 13, no history of cancer.   Patient's maternal ancestors are of Northern European/Native American descent, and paternal ancestors are of [place], minerals Jewish (small amount) descent. There is no known consanguinity.  GENETIC COUNSELING ASSESSMENT: Junius Creamer Pajak is a 22 y.o. male with a family history of Lynch syndrome and cancer.   We, therefore, discussed and recommended the following at today's visit.   [facility] SYNDROME/ MSH6 MUTATION:  Because Mr. Ryther's mother carries a Lynch Syndrome mutation, there is a 50% chance for Mr. Maule and each of his brothers to also have inherited this MSH6 mutation. We discussed the cancer risks and screening recommendations for individuals with Lynch Syndrome.   Colon cancer: The risk for individuals with a MSH6 mutation is about 10-22%. Some studies report a somewhat higher risk for males (up to 44% risk) - Screening:1.Colonoscopyever 1-2 yearsbeginning at age 38-25 or 2-5 years prior to the earliest colon cancer diagnosis.   Prostate Cancer: It has been reported the risk for prostate cancer for men with Lynch syndrome.  This risk has been reported to be up to 30%.  Very recent updates to the NCCN Lynch Syndrome guidelines (1.2019) suggest the prostate cancer risk is not significantly elevated for men with the MSH6 mutation specifically. (0-5% reported). There are no guidelines on prostate cancer screening for men with Lynch syndrome, but it is reasonable to consider earlier PSA and DRE screenings.   We would recommend increased prostate cancer screening for Mr. Hustead regardless of genetic test results given his father's history of prostate cancer.   Uterine Cancer:Individuals with a MSH6 mutation are estimated to have about 16-26% risk to  develop uterine cancer.   Ovarian Cancer: There is not enough evidence to confirm if there is any increased ovarian cancer risk for women with MSH60mtations specifically. Because there is not enough evidence to determine if there is any increased risk, surgery to remove ovaries is not recommended just based on having a MSH6 mutation.  Gastric Cancer: While there is no clear evidence to support screening for stomach and small bowel cancer, an upper endoscopy can be considered at 3-5 year intervals beginning at age of 467 However,the decision weatherto have this screening is best determined bythe patient'sgastroenterologist.    Other Cancers:There are a few other types of cancer risks that are reported in individuals withMSH6 mutations.These including urinary tract cancer, brain cancer, small bowel, biliary  tract, and pancreatic.  There are not specific guidelines regarding screening for these other types of cancer. Screening for these other types of cancer can be considered on a case by case basis. (taking into account personal and family history risk factors. This could include consideration of neurological exam, urinalysis, upper endoscopy, etc. (please refer to NCCN guidelines for more commentary/details).   REPRODUCTIVE RISK: If Mr. Hopkinson carries the familial MSH6 pathogenic variant, there would be a 50% risk for any future children to also inherit the mutation and also have Lynch syndrome.   If two people who both have Lynch Syndrome mutations in the same gene, have children, there is a risk for their child to have a rare childhood genetic condition called constitutional mismatch repair deficiency (CMMR-D) syndrome. If family members have this mutation, they may wish to have their partner tested if they are planning on having children.  TESTING:  We discussed the options to have genetic testing for a panel of genes or specifically for the variants identified in his mother.    His mother was screened with a large panel, but any genes he inherited from his father would not be assessed with the family variant testing only. Given his father's history of prostate cancer, it would be reasonable to analyze the paternally inherited genes as well.    Mr. Rakes elected to have Family Variant Testing only for now and declined panel testing.  (MSH6 mutation, and HRAS/MUTYH uncertain variants).  His father (with prostate cancer) is considering having genetic testing himself.   If he pursues testing and a mutation is identified, he will then consider further genetic testing.   OTHER DISCUSSION:  We discussed that some people do not want to undergo genetic testing due to fear of genetic discrimination.  A federal law called the Genetic Information Non-Discrimination Act (GINA) of 2008 helps protect individuals against genetic discrimination based on their genetic test results.  It impacts both health insurance and employment.  For health insurance, it protects against increased premiums, being kicked off insurance or being forced to take a test in order to be insured.  For employment it protects against hiring, firing and promoting decisions based on genetic test results.  Health status due to a cancer diagnosis is not protected under GINA.  This law does not protect life insurance, disability insurance, or other types of insurance.   Regarding the VUS's in HRAS and MUTYH found in his mother:At this time, it is unknown if thesevariants areassociated with increased cancer risk or if they are just benignfindings, but most variants such as this get reclassified to benign. Theyshould not be used to make medical management decisions.With time, we suspect the lab will determine the significance of thesevariants, if any. If we do learn more about it, patients are notified with a letter about the change of classification.   PLAN: After considering the risks, benefits, and  limitations, Mr. Neilan  provided informed consent to pursue genetic testing and the blood sample was sent to [facility] for analysis of the Family Variant Testing (MSH6 mutation, HRAS and MUTYH variants of uncertain significance).  Results should be available within approximately 2-3 weeks' time, at which point they will be disclosed by telephone to Mr. Angelino, as will any additional recommendations warranted by these results. Mr. Dauphin will receive a summary of his genetic counseling visit and a copy of his results once available. This information will also be available in Epic. We encouraged Mr. Hibbard to remain in contact with  cancer genetics annually so that we can continuously update the family history and inform him of any changes in cancer genetics and testing that may be of benefit for his family. Mr. Struckman questions were answered to his satisfaction today. Our contact information was provided should additional questions or concerns arise.  Based on Mr. Tatem's family history, we recommended his all of his relatives also consider having have genetic counseling and testing. Mr. Mcfarlan will let us know if we can be of any assistance in coordinating genetic counseling and/or testing for this family member.   Lastly, we encouraged Mr. Nabers to remain in contact with cancer genetics annually so that we can continuously update the family history and inform him of any changes in cancer genetics and testing that may be of benefit for this family.   Mr.  Gayton questions were answered to his satisfaction today. Our contact information was provided should additional questions or concerns arise. Thank you for the referral and allowing [place] to share in the care of your patient.   Tana Felts, MS, [facility] Certified Genetic Counselor [email or web address] phone: [phone]  The patient was seen for a total of 40 minutes in face-to-face genetic counseling. The  patient was accompanied today by his brother Alieu Finnigan and father, Mcdonald Reiling. This patient was discussed with Drs. Magrinat, Lindi Adie and/or Burr Medico who agrees with the above.

## 2018-06-13 ENCOUNTER — Ambulatory Visit: Payer: BLUE CROSS/BLUE SHIELD | Admitting: Family Medicine

## 2018-06-13 ENCOUNTER — Encounter: Payer: Self-pay | Admitting: Family Medicine

## 2018-06-13 VITALS — BP 118/82 | HR 85 | Temp 98.3°F | Ht 72.0 in | Wt 353.8 lb

## 2018-06-13 DIAGNOSIS — H9191 Unspecified hearing loss, right ear: Secondary | ICD-10-CM

## 2018-06-13 NOTE — Progress Notes (Signed)
   Subjective:    Patient ID: Ricardo [name], male    DOB: 1996-11-29, 22 y.o.   MRN: [id number]  HPI Here for decreased hearing in the right ear. He was seen here on 06-02-18 for pain in both ears and was diagnosed with bilateral otitis externa. He was treated with Ciprodex drops. Now the ear pain has resolved but he still has trouble with hearing on the right side. He is still using the drops 5-6 times daily. No sinus congestion.    Review of Systems  Constitutional: Negative.   HENT: Positive for hearing loss. Negative for congestion, ear pain, postnasal drip, sinus pressure, sinus pain and sore throat.   Respiratory: Negative.        Objective:   Physical Exam  Constitutional: He appears well-developed and well-nourished.  HENT:  Left Ear: External ear normal.  Nose: Nose normal.  Mouth/Throat: Oropharynx is clear and moist.  Right external canal is slightly red and slightly swollen. The TM is clear  Eyes: Conjunctivae are normal.  Neck: No thyromegaly present.  Pulmonary/Chest: Effort normal and breath sounds normal.  Lymphadenopathy:    He has no cervical adenopathy.          Assessment & Plan:  The otitis externa appears to be resolved but the right canal is swollen. This may be from irritation that results from using the drops so long. I recommended he stop the drops, and I think the right ear canal swelling will then resolve over the next few days. Alysia Penna, MD

## 2018-06-20 ENCOUNTER — Telehealth: Payer: Self-pay | Admitting: Genetics

## 2018-06-20 NOTE — Telephone Encounter (Signed)
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  We discussed that we would recommend colonoscopy to start 20-25 years and repeat every 1-2 years.   Other screenings can be considered in the future (see clinic note for further discussion).   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  Mr. Leite declined referral to high risk clinic at the cancer center and would like his primary care physician Dr. Sarajane Jews to follow him for this result and make referral to GI.  We will send these results and our recommendations to Dr. Sarajane Jews.  Mr. Mcgillis was provided my contact information if he has further questions.

## 2018-06-27 ENCOUNTER — Telehealth: Payer: Self-pay | Admitting: Family Medicine

## 2018-06-27 ENCOUNTER — Encounter: Payer: Self-pay | Admitting: Gastroenterology

## 2018-06-27 DIAGNOSIS — Z148 Genetic carrier of other disease: Secondary | ICD-10-CM

## 2018-06-27 NOTE — Telephone Encounter (Signed)
The referral to GI was done

## 2018-06-27 NOTE — Telephone Encounter (Signed)
Called and spoke with pt. Pt advised and voiced understanding.  

## 2018-07-01 ENCOUNTER — Encounter: Payer: Self-pay | Admitting: Genetics

## 2018-07-01 ENCOUNTER — Ambulatory Visit: Payer: Self-pay | Admitting: Genetics

## 2018-07-01 DIAGNOSIS — Z1379 Encounter for other screening for genetic and chromosomal anomalies: Secondary | ICD-10-CM

## 2018-07-01 DIAGNOSIS — Z803 Family history of malignant neoplasm of breast: Secondary | ICD-10-CM

## 2018-07-01 DIAGNOSIS — Z1509 Genetic susceptibility to other malignant neoplasm: Secondary | ICD-10-CM

## 2018-07-01 DIAGNOSIS — Z800 Family history of malignant neoplasm of digestive organs: Secondary | ICD-10-CM

## 2018-07-01 DIAGNOSIS — Z15068 Genetic susceptibility to other malignant neoplasm of digestive system: Secondary | ICD-10-CM

## 2018-07-01 DIAGNOSIS — Z8042 Family history of malignant neoplasm of prostate: Secondary | ICD-10-CM

## 2018-07-01 DIAGNOSIS — Z8041 Family history of malignant neoplasm of ovary: Secondary | ICD-10-CM

## 2018-07-01 DIAGNOSIS — Z801 Family history of malignant neoplasm of trachea, bronchus and lung: Secondary | ICD-10-CM

## 2018-07-01 NOTE — Progress Notes (Signed)
GENETIC TEST RESULTS  Patient Name: Ricardo [name] Patient Age: 22 y.o. Encounter Date: 07/01/2018  Referring Provider: Self/mother  Primary Care Provider: Alysia Penna  HPI: Ricardo [name] was previously seen in the [place] clinic due to a family of cancer and concerns regarding a hereditary predisposition to cancer. Please refer to our prior cancer genetics clinic note for more information regarding Ricardo [name]'s medical, social and family histories, and our assessment and recommendations, at the time. Ricardo [name] recent genetic test results were disclosed to her, as were recommendations warranted by these results. These results and recommendations are discussed in more detail below.   FAMILY HISTORY:  We obtained a detailed, 4-generation family history.  Significant diagnoses are listed below: Family History  Problem Relation Age of Onset  . Breast cancer Mother 45  . Other Mother        Lynch Syndrome- MSH6 mutation  . Prostate cancer Father 46       Gleason 7  . Non-Hodgkin's lymphoma Paternal Uncle   . Lung cancer Paternal Grandfather        died at 35  . Prostate cancer Other        both dx in 79's  . Ovarian cancer Other        dx 68's   Ricardo [name] does not have any children. He has 2 brothers ages 42 and 66.   [name]'s father:recently diagnosed with prostate cancer at 13, Gleason 7 Paternal Aunts/Uncles:1 paternal aunt, 4 paternal uncles. 1 uncle has a hx of non-hodgkin's Lymphoma Paternal cousins:no history of cancer Paternal grandfather:lung cancer, died at 77. His mother had lung cancer Paternal grandmother:80, no history of cancer. (7% Ashkenazi Jewish). She has 2 brothers (pateint's great uncles) who had prostate cancer in their 61's. (both survived their cancer, gleason unk). Her father had lung cancer.   [name]'s mother:recently diagnosed with breast cancer at 71. She underwent genetic testing in June 2019 that  revealed a Pathogenic variant in the gene MSH6 c.3226C>T (p.Arg1076Cys). This is consistent with Lynch Syndrome. Her genetic testing also revealed 2 variants of uncertain significance in the genes HRAS c.111+6C>T (Intronic)and MUTYHc.1256C>A (p.Ala419Asp). Maternal Aunts/Uncles:1 maternal aunt,unk Maternal cousins:unk Maternal grandfather:no history of cancer. He has a sister who had ovarian cancer in her 62;s, and another sister who had cancer (type unk) Maternal grandmother:died at 59, no history of cancer.  Patient's maternal ancestors are of[employer] Herbalist, and paternal ancestors are of [employer] Surveyor, minerals Jewish (small amount)descent. There is noknown consanguinity.  GENETIC TESTING:  At the time of Ricardo [name]'s visit, Ricardo [name] elected to have genetic testing for the variants previously identified in his mother These include:  Pathogenic varaint in MSH6 Variants of uncertain significance in HRAS and MUTYH.   This family variant testing was performed at [facility].   This testing identified a single, heterozygous pathogenic gene mutation called MSH6, c.3226C>T (p.Arg1076Cys) confirming the diagnosis of Lynch syndrome.  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      CLINICAL CONDITION Lynch syndrome is characterized by an increased risk of colorectal cancer as well as cancers of the endometrium, ovary, prostate, stomach, small intestine, hepatobiliary tract, urinary tract, pancreas and brain (PMID: 3875643, 32951884, 16606301). Lynch syndrome tumors typically demonstrate microsatellite instability (MSI) as well as loss of expression of the mismatch repair proteins on immunohistochemical (IHC) staining. This  condition is caused by pathogenic variants in one of the mismatch repair  genes-MLH1, MSH2, MSH6, PMS2-as well as deletions in the 3' end of the EPCAM gene.  RISKS    Please note these estimates change over time as more data is available and should be directly referenced when managing his care in the future.   MANAGEMENT: Surveillance guidelines, as published by the [employer]  (NCCN), suggest the following for individuals with Lynch syndrome (*NCCN. Genetic/Familial High-Risk Assessment: Colorectal. Version 2.2016):   Colon cancer:              - Colonoscopy every 1-2 years starting at 35-43 years of age, or 2-5 years prior to the earliest colon cancer if diagnosed before age 53.   Gynecological cancer:             Uterine: There is no clear evidence to support routine endometrial cancer screening, nor do the data support ovarian cancer screening; however annual office endometrial sampling is an option.  Education regarding prompt evaluation for dysfunctional uterine bleeding.  Prophylactic hysterectomy can be considered.      Ovarian: Insufficient evidence exists to make any specific recommendations for Prophylactic bilateral salpingo-oophorectomy (BSO) for patients with mutations in MSH6 and PMS2.  This may be considered by women after childbearing is complete. Serum CA-125 for ovarian cancer and transvaginal ultrasound for ovarian and endometrial cancer may be considered at the clinician's discretion. These modalities have not been shown to be sufficiently sensitive or specific to support a positive recommendation.   Other extra-colonic cancers             - While there is no clear evidence to support screening for gastric, duodenal, and small bowel cancer, select individuals, families, or those of Asian descent may     consider esophagogastroduodenoscopy (EGD) with extended duodenoscopy every 3-5 years beginning at age 71-58 years of age.             - Consider testing for and treating H. pylori.             - Consider annual  urinalysis beginning at 72-29 years of age to screen for urothelial cancer.             - Consider annual physical/neurologic examination starting at 105-29 years of age.   Current data suggests that individuals with Lynch Syndrome mutations may be at an increased risk for prostate and breast cancer.  However at this time, insufficient evidence exists to make evidence based recommendations.  He may discuss prostate cancer screening options with his physicians when he approaches his 45's.   For Ricardo [name] specifically we would recommend increased/earlier prostate cancer screening due to his paternal family history of prostate cancer as well.  His father had prostate cancer in his early 41's.    NCCN states that no screening recommendation can be made for pancreatic cancer at this time (*NCCN. Genetic/Familial High-Risk Assessment: Colorectal. Version 2.2016). In contrast, the [employer] of Gastroenterology Clinical Guidelines recommend pancreatic cancer screening for individuals with Lynch syndrome with a first- or second-degree relative affected with pancreatic cancer (PMID: 35009381, 82993716). Ideally, screening should be performed in experienced centers utilizing a multidisciplinary approach under research conditions. Recommended screening includes annual endoscopic ultrasound and/or MRI of the pancreas starting at age 82 or 40 years younger than the earliest age of pancreatic cancer diagnosis in the family (PMID: 96789381).    Please note: NCCN guidelines are updated regularly and are subject to change.  They should be reference directly in the future when managing Ricardo [name]'s care.   FAMILY MEMBERS:  Lynch Syndrome has Autosomal Dominant Inheritance.  Since we now know the mutation in Ricardo [name], we can test at-risk relatives to determine whether or not they have inherited the mutation and are at increased risk for cancer.  It is important that all of Ricardo [name]'s relatives (both  men and women) know of the presence of this gene mutation. Site-specific genetic testing can sort out who in the family is at risk and who is not. We will be happy to meet with any of the family members or refer them to a genetic counselor in their local area. To locate genetic counselors in other cities, individuals can visit the website of the [employer] (ArtistMovie.se) and Secretary/administrator for a Social worker by zip code.   Any future children Ricardo [name] may have are at a 50% chance to inherited this mutation. We also discussed that individuals with Lynch Syndrome are carriers for a more severe autosomal recessive condition called Constitutional mismatch repair deficiency.   Children who inherit two mutations in the same Lynch gene, one mutation from each parent, are at-risk of a rare recessive condition called constitutional mismatch repair deficiency (CMMR-D) syndrome. If family members have this mutation, they may wish to have their partner tested if they are planning on having children.  Ricardo [name]'s siblings also have a 50% chance to have inherited this mutation. We recommend they and all maternal relativse have genetic testing for this same mutation, as identifying the presence of this mutation would allow them to also take advantage of risk-reducing measures. Based on Ricardo [name]'s paternal family history of prostate cancer, we recommended his father /paternal relatives also have genetic testing for this family history.    PLAN: Mr. Ansell will need to be followed as high risk based on his diagnosis of Lynch syndrome.    Mr. Kellman does not have a gastroenterologist to perform follow up for the diagnosis of Lynch syndrome.  He has identified his primary care physician Dr. Alysia Penna to help coordinate the appropriate referrals to GI and coordinate future screenings.     We have suggested that he be seen [facility] that follows patients at high  risk for cancer.  Mr. Pennings declined this referral at this time.  He knows this option is available should he be interested in the future.  Particularly as he gets older and more screenings may be considred he may elect to be seen in this specialized clinic.   RESOURCES:  Mr. Briner was given information about the support groups 'it takes guts' and 'Alivenkickin' for individuals with Lynch Syndrome and hereditary colon cancer risk.   We strongly encouraged Mr. Stith to remain in contact with [place] in cancer genetics on an annual basis so we can update Mr. Clift's personal and family histories, and inform him of advances in cancer genetics that may be of benefit for the entire family. Mr. Narez knows he is also welcome to call with any questions or concerns, at any time.   Tana Felts, MS, [facility] Certified Genetic Counselor  [phone]

## 2018-07-21 ENCOUNTER — Ambulatory Visit: Payer: BLUE CROSS/BLUE SHIELD | Admitting: Family Medicine

## 2018-07-21 ENCOUNTER — Encounter: Payer: Self-pay | Admitting: Family Medicine

## 2018-07-21 VITALS — BP 120/70 | HR 88 | Temp 98.2°F | Ht 72.0 in | Wt 350.2 lb

## 2018-07-21 DIAGNOSIS — Z8739 Personal history of other diseases of the musculoskeletal system and connective tissue: Secondary | ICD-10-CM

## 2018-07-21 NOTE — Progress Notes (Signed)
   Subjective:    Patient ID: Ricardo [name], male    DOB: [birth year], 22 y.o.   MRN: [id number]  HPI Here to follow up on his back. Earlier this year he was having a fair amount of low back pain and he saw Dr. Lynann Bologna, who ordered an MRI scan. This showed a couple of herniated discs, but these were not considered to require surgery. In fact the pain has completely resolved and he feels great. He has applied for a new job and they are requesting a medical note stating that his back is okay.    Review of Systems  Constitutional: Negative.   Respiratory: Negative.   Cardiovascular: Negative.   Musculoskeletal: Negative.   Neurological: Negative.        Objective:   Physical Exam  Constitutional: He is oriented to person, place, and time. He appears well-developed and well-nourished.  Cardiovascular: Normal rate, regular rhythm, normal heart sounds and intact distal pulses.  Pulmonary/Chest: Effort normal and breath sounds normal.  Neurological: He is alert and oriented to person, place, and time.          Assessment & Plan:  Hx of back pain, resolved. We did provide a note stating that he is cleared to perform all work duties with no restrictions. Recheck prn. Alysia Penna, MD

## 2018-08-12 ENCOUNTER — Encounter: Payer: Self-pay | Admitting: Family Medicine

## 2018-08-12 ENCOUNTER — Ambulatory Visit: Payer: BLUE CROSS/BLUE SHIELD | Admitting: Family Medicine

## 2018-08-12 VITALS — BP 112/70 | HR 105 | Temp 98.5°F | Ht 72.0 in | Wt 352.8 lb

## 2018-08-12 DIAGNOSIS — L400 Psoriasis vulgaris: Secondary | ICD-10-CM | POA: Diagnosis not present

## 2018-08-12 MED ORDER — BETAMETHASONE VALERATE 0.1 % EX OINT: 1.0000 "application " | TOPICAL_OINTMENT | Freq: Two times a day (BID) | CUTANEOUS | 2 refills | Status: AC

## 2018-08-12 NOTE — Progress Notes (Signed)
   Subjective:    Patient ID: Ricardo [name], male    DOB: 1996-08-28, 22 y.o.   MRN: [id number]  HPI Here for his psoriasis. He has been using Triamcinolone cream and Cerave, but lately the psoriasis has been getting worse.    Review of Systems  Constitutional: Negative.   Respiratory: Negative.   Cardiovascular: Negative.   Skin: Positive for rash.       Objective:   Physical Exam  Constitutional: He appears well-developed and well-nourished.  Cardiovascular: Normal rate, regular rhythm, normal heart sounds and intact distal pulses.  Pulmonary/Chest: Effort normal and breath sounds normal.  Skin:  Areas of plaquing over both elbows and forearms and hands           Assessment & Plan:  His psoriasis is not controlled. We will switch to Betamethasone valerate ointment. Refer to Dermatology since he is a good candidate for biologic agents.  Alysia Penna, MD

## 2018-08-26 ENCOUNTER — Encounter (INDEPENDENT_AMBULATORY_CARE_PROVIDER_SITE_OTHER): Payer: Self-pay

## 2018-08-26 ENCOUNTER — Ambulatory Visit: Payer: BLUE CROSS/BLUE SHIELD | Admitting: Gastroenterology

## 2018-08-26 ENCOUNTER — Encounter

## 2018-08-26 ENCOUNTER — Encounter: Payer: Self-pay | Admitting: Gastroenterology

## 2018-08-26 VITALS — BP 106/60 | HR 92 | Ht 73.0 in | Wt 343.0 lb

## 2018-08-26 DIAGNOSIS — Z1509 Genetic susceptibility to other malignant neoplasm: Secondary | ICD-10-CM

## 2018-08-26 NOTE — Progress Notes (Signed)
Ricardo [name] Gastroenterology Consult Note:  [name]: Ricardo [name] 08/26/2018  Referring physician: Laurey Morale, MD  Reason for consult/chief complaint: Lynch syndrome (Discuss colonoscopy )   Subjective  HPI:  This is a 22 year old man referred by primary care and genetic counselor for Lynch syndrome mutation. His mother was recently found to have an [facility] 6 mutation consistent with Lynch syndrome, and Ricardo [name] tested positive for the same mutation. Ricardo [name], but denies abdominal pain, altered bowel habits, rectal bleeding, nausea or vomiting.  ROS:  Review of Systems He denies chest pain dyspnea or dysuria He has chronic psoriasis skin rash Depression   past Medical [name]: Past Medical [name]:  Diagnosis Date  . Chicken pox   . Depression   . Family [name] of breast cancer   . Family [name] of lung cancer   . Family [name] of Lynch syndrome   . Family [name] of ovarian cancer   . Family [name] of prostate cancer   . Hydradenitis    suppurativa     Past Surgical [name]: [name] reviewed. No pertinent surgical [name].   Family [name]: Family [name]  Problem Relation Age of Onset  . Breast cancer Mother 17  . Other Mother        Lynch Syndrome- MSH6 mutation  . Prostate cancer Father 5       Gleason 7  . Non-Hodgkin's lymphoma Paternal Uncle   . Lung cancer Paternal Grandfather        died at 85  . Prostate cancer Other        both dx in 71's  . Ovarian cancer Other        dx 79's    Social [name]: Social [name]   Socioeconomic [name]  . Marital status: Single    Spouse name: Not on file  . Number of children: Not on file  . Years of education: Not on file  . Highest education level: Not on file  Occupational [name]  . Not on file  Social Needs  . Financial resource strain: Not on file  . Food insecurity:    Worry: Not on file    Inability: Not on file  . Transportation needs:   Medical: Not on file    Non-medical: Not on file  Tobacco Use  . Smoking status: Never Smoker  . Smokeless tobacco: Never Used  Substance and Sexual Activity  . Alcohol use: No    Alcohol/week: 0.0 standard drinks  . Drug use: Yes    Types: Marijuana    Comment: every day  . Sexual activity: Never  Lifestyle  . Physical activity:    Days per week: Not on file    Minutes per session: Not on file  . Stress: Not on file  Relationships  . Social connections:    Talks on phone: Not on file    Gets together: Not on file    Attends religious service: Not on file    Active member of club or organization: Not on file    Attends meetings of clubs or organizations: Not on file    Relationship status: Not on file  Other Topics Concern  . Not on file  Social [name] Narrative  . Not on file    Allergies: Allergies  Allergen Reactions  . Sulfonamide Derivatives     Outpatient Meds: Current Outpatient Medications  Medication Sig Dispense Refill  . betamethasone valerate ointment (VALISONE) 0.1 % Apply 1 application topically 2 (two) times  daily. (Patient taking differently: Apply 1 application topically as needed. ) 45 g 2  . FLUoxetine (PROZAC) 20 MG tablet Take 1 tablet (20 mg total) by mouth daily. 90 tablet 3   No current facility-administered medications for this visit.       ___________________________________________________________________ Objective   Exam:  BP 106/60   Pulse 92   Ht 6' 1" (1.854 m)   Wt (!) 343 lb (155.6 kg)   BMI 45.25 kg/m    General: this is a(n) obese young man, restricted affect  Eyes: sclera anicteric, no redness  ENT: oral mucosa moist without lesions, no cervical or supraclavicular lymphadenopathy, good dentition  CV: RRR without murmur, S1/S2, no JVD, no peripheral edema  Resp: clear to auscultation bilaterally, normal RR and effort noted  GI: soft, no tenderness, with active bowel sounds. No guarding or palpable organomegaly  noted.  Skin; warm and dry, no rash or jaundice noted.  Extensive psoriasis  Neuro: awake, alert and oriented x 3. Normal gross motor function and fluent speech  Labs:  Genetic testing results as noted above  Assessment: Encounter Diagnosis  Name Primary?  Marland Kitchen MSH6-related Lynch syndrome (HNPCC5) Yes    Ricardo [name] is in need of a screening colonoscopy, and has not yet reached the age for upper GI screening with this condition. He is agreeable after discussion of procedure and risks. His procedure is an increased risk for respiratory complications due to his BMI, and must be done in the hospital outpatient endoscopy department.   The benefits and risks of the planned procedure were described in detail with the patient or (when appropriate) their health care proxy.  Risks were outlined as including, but not limited to, bleeding, infection, perforation, adverse medication reaction leading to cardiac or pulmonary decompensation, or pancreatitis (if ERCP).  The limitation of incomplete mucosal visualization was also discussed.  No guarantees or warranties were given.   Thank you for the courtesy of this consult.  Please call me with any questions or concerns.  [name]  CC: Laurey Morale, MD

## 2018-08-29 ENCOUNTER — Other Ambulatory Visit: Payer: Self-pay

## 2018-08-29 DIAGNOSIS — Z1509 Genetic susceptibility to other malignant neoplasm: Secondary | ICD-10-CM

## 2018-09-30 ENCOUNTER — Telehealth: Payer: Self-pay | Admitting: Gastroenterology

## 2018-09-30 NOTE — Telephone Encounter (Signed)
Dad called states that prep is too expensive needs generic called in

## 2018-10-01 NOTE — Telephone Encounter (Signed)
Pt has been notified and aware to pick up a free sample kit.

## 2018-10-07 ENCOUNTER — Encounter (HOSPITAL_COMMUNITY)
Admission: RE | Disposition: A | Discharge status: Home / Self Care | Payer: Self-pay | Source: Ambulatory Visit | Attending: Gastroenterology

## 2018-10-07 ENCOUNTER — Ambulatory Visit (HOSPITAL_COMMUNITY)
Admission: RE | Admit: 2018-10-07 | Discharge: 2018-10-07 | Disposition: A | Discharge status: Home / Self Care | Payer: BLUE CROSS/BLUE SHIELD | Source: Ambulatory Visit | Attending: Gastroenterology | Admitting: Gastroenterology

## 2018-10-07 ENCOUNTER — Ambulatory Visit (HOSPITAL_COMMUNITY): Payer: BLUE CROSS/BLUE SHIELD | Admitting: Anesthesiology

## 2018-10-07 ENCOUNTER — Other Ambulatory Visit: Payer: Self-pay

## 2018-10-07 ENCOUNTER — Encounter (HOSPITAL_COMMUNITY): Payer: Self-pay | Admitting: Anesthesiology

## 2018-10-07 DIAGNOSIS — Z1211 Encounter for screening for malignant neoplasm of colon: Secondary | ICD-10-CM | POA: Diagnosis not present

## 2018-10-07 DIAGNOSIS — D122 Benign neoplasm of ascending colon: Secondary | ICD-10-CM | POA: Insufficient documentation

## 2018-10-07 DIAGNOSIS — Z79899 Other long term (current) drug therapy: Secondary | ICD-10-CM | POA: Diagnosis not present

## 2018-10-07 DIAGNOSIS — Z882 Allergy status to sulfonamides status: Secondary | ICD-10-CM | POA: Diagnosis not present

## 2018-10-07 DIAGNOSIS — Z1509 Genetic susceptibility to other malignant neoplasm: Secondary | ICD-10-CM

## 2018-10-07 DIAGNOSIS — Z6841 Body Mass Index (BMI) 40.0 and over, adult: Secondary | ICD-10-CM | POA: Diagnosis not present

## 2018-10-07 DIAGNOSIS — F329 Major depressive disorder, single episode, unspecified: Secondary | ICD-10-CM | POA: Diagnosis not present

## 2018-10-07 HISTORY — PX: COLONOSCOPY WITH PROPOFOL: SHX5780

## 2018-10-07 HISTORY — PX: POLYPECTOMY: SHX5525

## 2018-10-07 SURGERY — COLONOSCOPY WITH PROPOFOL
Anesthesia: Monitor Anesthesia Care

## 2018-10-07 MED ORDER — PROPOFOL 10 MG/ML IV BOLUS
INTRAVENOUS | Status: DC | PRN
  Administered 2018-10-07 (×3): 20 mg via INTRAVENOUS

## 2018-10-07 MED ORDER — PROPOFOL 500 MG/50ML IV EMUL
INTRAVENOUS | Status: DC | PRN
  Administered 2018-10-07: 150 ug/kg/min via INTRAVENOUS

## 2018-10-07 MED ORDER — SODIUM CHLORIDE 0.9 % IV SOLN: INTRAVENOUS | Status: DC

## 2018-10-07 MED ORDER — LIDOCAINE 2% (20 MG/ML) 5 ML SYRINGE
INTRAMUSCULAR | Status: DC | PRN
  Administered 2018-10-07: 80 mg via INTRAVENOUS

## 2018-10-07 MED ORDER — LACTATED RINGERS IV SOLN
INTRAVENOUS | Status: DC
  Administered 2018-10-07: 09:00:00 via INTRAVENOUS

## 2018-10-07 MED ORDER — PROPOFOL 10 MG/ML IV BOLUS
INTRAVENOUS | Status: AC
  Filled 2018-10-07: qty 20

## 2018-10-07 MED ORDER — PROPOFOL 10 MG/ML IV BOLUS
INTRAVENOUS | Status: AC
  Filled 2018-10-07: qty 60

## 2018-10-07 SURGICAL SUPPLY — 21 items

## 2018-10-07 NOTE — Transfer of Care (Signed)
Immediate Anesthesia Transfer of Care Note  Patient: Ricardo [name]  Procedure(s) Performed: COLONOSCOPY WITH PROPOFOL (N/A ) POLYPECTOMY  Patient Location: PACU and Endoscopy Unit  Anesthesia Type:MAC  Level of Consciousness: drowsy and patient cooperative  Airway & Oxygen Therapy: Patient Spontanous Breathing and Patient connected to face mask oxygen  Post-op Assessment: Report given to RN and Post -op Vital signs reviewed and stable  Post vital signs: Reviewed and stable  Last Vitals:  Vitals Value Taken Time  BP    Temp    Pulse    Resp    SpO2      Last Pain:  Vitals:   10/07/18 0845  TempSrc: Oral  PainSc: 0-No pain         Complications: No apparent anesthesia complications

## 2018-10-07 NOTE — H&P (Signed)
History:  This patient presents for endoscopic testing for lynch syndrome colon cancer screening.  Ricardo [name] Referring physician: Laurey Morale, MD  Past Medical History: Past Medical History:  Diagnosis Date  . Chicken pox   . Depression   . Family history of breast cancer   . Family history of lung cancer   . Family history of Lynch syndrome   . Family history of ovarian cancer   . Family history of prostate cancer   . Hydradenitis    suppurativa     Past Surgical History: History reviewed. No pertinent surgical history.  Allergies: Allergies  Allergen Reactions  . Sulfonamide Derivatives     Unknown    Outpatient Meds: Current Facility-Administered Medications  Medication Dose Route Frequency Provider Last Rate Last Dose  . 0.9 %  sodium chloride infusion   Intravenous Continuous Danis, Estill Cotta III, MD      . lactated ringers infusion   Intravenous Continuous [name], MD 10 mL/hr at 10/07/18 0854        ___________________________________________________________________ Objective   Exam:  BP (!) 115/54   Pulse 65   Temp 98.5 F (36.9 C) (Oral)   Resp (!) 21   Ht 6\' 1"  (1.854 m)   Wt (!) 158.8 kg   SpO2 97%   BMI 46.18 kg/m    CV: RRR without murmur, S1/S2, no JVD, no peripheral edema  Resp: clear to auscultation bilaterally, normal RR and effort noted  GI: soft, no tenderness, with active bowel sounds. No guarding or palpable organomegaly noted.  Neuro: awake, alert and oriented x 3. Normal gross motor function and fluent speech   Assessment:  Lynch syndrome  Plan:  colonoscopy   [name]

## 2018-10-07 NOTE — Discharge Instructions (Signed)
YOU HAD AN ENDOSCOPIC PROCEDURE TODAY: Refer to the procedure report and other information in the discharge instructions given to you for any specific questions about what was found during the examination. If this information does not answer your questions, please call [place] office at [phone] to clarify.  ° °YOU SHOULD EXPECT: Some feelings of bloating in the abdomen. Passage of more gas than usual. Walking can help get rid of the air that was put into your GI tract during the procedure and reduce the bloating. If you had a lower endoscopy (such as a colonoscopy or flexible sigmoidoscopy) you may notice spotting of blood in your stool or on the toilet paper. Some abdominal soreness may be present for a day or two, also. ° °DIET: Your first meal following the procedure should be a light meal and then it is ok to progress to your normal diet. A half-sandwich or bowl of soup is an example of a good first meal. Heavy or fried foods are harder to digest and may make you feel nauseous or bloated. Drink plenty of fluids but you should avoid alcoholic beverages for 24 hours. If you had a esophageal dilation, please see attached instructions for diet.   ° °ACTIVITY: Your care partner should take you home directly after the procedure. You should plan to take it easy, moving slowly for the rest of the day. You can resume normal activity the day after the procedure however YOU SHOULD NOT DRIVE, use power tools, machinery or perform tasks that involve climbing or major physical exertion for 24 hours (because of the sedation medicines used during the test).  ° °SYMPTOMS TO REPORT IMMEDIATELY: °A gastroenterologist can be reached at any hour. Please call [phone]  for any of the following symptoms:  °Following lower endoscopy (colonoscopy, flexible sigmoidoscopy) °Excessive amounts of blood in the stool  °Significant tenderness, worsening of abdominal pains  °Swelling of the abdomen that is new, acute  °Fever of 100° or  higher  °Following upper endoscopy (EGD, EUS, ERCP, esophageal dilation) °Vomiting of blood or coffee ground material  °New, significant abdominal pain  °New, significant chest pain or pain under the shoulder blades  °Painful or persistently difficult swallowing  °New shortness of breath  °Black, tarry-looking or red, bloody stools ° °FOLLOW UP:  °If any biopsies were taken you will be contacted by phone or by letter within the next 1-3 weeks. Call [phone]  if you have not heard about the biopsies in 3 weeks.  °Please also call with any specific questions about appointments or follow up tests. ° °

## 2018-10-07 NOTE — Anesthesia Preprocedure Evaluation (Addendum)
Anesthesia Evaluation  Patient identified by MRN, date of birth, ID band Patient awake    Reviewed: Allergy & Precautions, NPO status , Patient's Chart, lab work & pertinent test results  Airway Mallampati: III  TM Distance: >3 FB Neck ROM: Full    Dental no notable dental hx. (+) Teeth Intact   Pulmonary neg pulmonary ROS,    Pulmonary exam normal breath sounds clear to auscultation       Cardiovascular negative cardio ROS Normal cardiovascular exam Rhythm:Regular Rate:Normal     Neuro/Psych PSYCHIATRIC DISORDERS Anxiety Depression negative neurological ROS     GI/Hepatic Neg liver ROS, Lynch syndrome   Endo/Other  Morbid obesity  Renal/GU negative Renal ROS  negative genitourinary   Musculoskeletal negative musculoskeletal ROS (+)   Abdominal (+) + obese,   Peds  Hematology negative hematology ROS (+)   Anesthesia Other Findings   Reproductive/Obstetrics                             Anesthesia Physical Anesthesia Plan  ASA: III  Anesthesia Plan: MAC   Post-op Pain Management:    Induction: Intravenous  PONV Risk Score and Plan: Propofol infusion, Ondansetron and Treatment may vary due to age or medical condition  Airway Management Planned: Natural Airway and Simple Face Mask  Additional Equipment:   Intra-op Plan:   Post-operative Plan:   Informed Consent: I have reviewed the patients History and Physical, chart, labs and discussed the procedure including the risks, benefits and alternatives for the proposed anesthesia with the patient or authorized representative who has indicated his/her understanding and acceptance.   Dental advisory given  Plan Discussed with: CRNA and Surgeon  Anesthesia Plan Comments:         Anesthesia Quick Evaluation

## 2018-10-07 NOTE — Anesthesia Procedure Notes (Signed)
Procedure Name: MAC Date/Time: 10/07/2018 9:42 AM Performed by: Dione Booze, CRNA Pre-anesthesia Checklist: Patient identified, Emergency Drugs available, Suction available and Patient being monitored Oxygen Delivery Method: Simple face mask Placement Confirmation: positive ETCO2

## 2018-10-07 NOTE — Anesthesia Postprocedure Evaluation (Signed)
Anesthesia Post Note  Patient: Ricardo [name]  Procedure(s) Performed: COLONOSCOPY WITH PROPOFOL (N/A ) POLYPECTOMY     Patient location during evaluation: PACU Anesthesia Type: MAC Level of consciousness: awake and alert and oriented Pain management: pain level controlled Vital Signs Assessment: post-procedure vital signs reviewed and stable Respiratory status: spontaneous breathing, nonlabored ventilation and respiratory function stable Cardiovascular status: stable and blood pressure returned to baseline Postop Assessment: no apparent nausea or vomiting Anesthetic complications: no    Last Vitals:  Vitals:   10/07/18 1020 10/07/18 1030  BP: 113/64 114/65  Pulse: (!) 50 67  Resp: 16 14  Temp:    SpO2: 100% 98%    Last Pain:  Vitals:   10/07/18 1030  TempSrc:   PainSc: 0-No pain                 Christi Wirick A.

## 2018-10-07 NOTE — Interval H&P Note (Signed)
History and Physical Interval Note:  10/07/2018 9:26 AM  Ricardo [name]  has presented today for surgery, with the diagnosis of MSH6 related lynch syndrome  The various methods of treatment have been discussed with the patient and family. After consideration of risks, benefits and other options for treatment, the patient has consented to  Procedure(s): COLONOSCOPY WITH PROPOFOL (N/A) as a surgical intervention .  The patient's history has been reviewed, patient examined, no change in status, stable for surgery.  I have reviewed the patient's chart and labs.  Questions were answered to the patient's satisfaction.     [name]

## 2018-10-07 NOTE — Op Note (Signed)
[facility] Patient Name: Ricardo [name] Procedure Date: 10/07/2018 MRN: [id number] Attending MD: Estill Cotta. Loletha Carrow , MD Date of Birth: December 18, 1995 CSN: [id number] Age: 22 Admit Type: Outpatient Procedure:                Colonoscopy Indications:              Lynch Syndrome Providers:                Mallie Mussel L. Loletha Carrow, MD, Cleda Daub, RN, Cletis Athens,                            Technician, Dione Booze, CRNA Referring MD:              Medicines:                Monitored Anesthesia Care Complications:            No immediate complications. Estimated Blood Loss:     Estimated blood loss was minimal. Procedure:                Pre-Anesthesia Assessment:                           - Prior to the procedure, a History and Physical                            was performed, and patient medications and                            allergies were reviewed. The patient's tolerance of                            previous anesthesia was also reviewed. The risks                            and benefits of the procedure and the sedation                            options and risks were discussed with the patient.                            All questions were answered, and informed consent                            was obtained. Prior Anticoagulants: The patient has                            taken no previous anticoagulant or antiplatelet                            agents. ASA Grade Assessment: III - A patient with                            severe systemic disease. After reviewing the risks  and benefits, the patient was deemed in                            satisfactory condition to undergo the procedure.                           After obtaining informed consent, the colonoscope                            was passed under direct vision. Throughout the                            procedure, the patient's blood pressure, pulse, and   oxygen saturations were monitored continuously. The                            CF-HQ190L ([id number]) Olympus Adult Colonoscope was                            introduced through the anus and advanced to the the                            cecum, identified by appendiceal orifice and                            ileocecal valve. The colonoscopy was performed with                            difficulty due to the patient's agitation and                            frequent movement. The patient tolerated the                            procedure. The quality of the bowel preparation was                            excellent. The ileocecal valve, appendiceal                            orifice, and rectum were photographed. Scope In: 9:50:46 AM Scope Out: 10:04:04 AM Scope Withdrawal Time: 0 hours 11 minutes 5 seconds  Total Procedure Duration: 0 hours 13 minutes 18 seconds  Findings:      The perianal and digital rectal examinations were normal.      A 12 mm polyp was found in the ascending colon. The polyp was sessile       with a mucus cap. The polyp was removed with a piecemeal technique using       a cold snare. Resection and retrieval were complete.      The exam was otherwise without abnormality on direct and retroflexion       views. Impression:               - One 12 mm polyp in the ascending colon, removed  piecemeal using a cold snare. Resected and                            retrieved.                           - The examination was otherwise normal on direct                            and retroflexion views. Moderate Sedation:      MAC sedation used Recommendation:           - Patient has a contact number available for                            emergencies. The signs and symptoms of potential                            delayed complications were discussed with the                            patient. Return to normal activities tomorrow.                             Written discharge instructions were provided to the                            patient.                           - Resume previous diet.                           - Continue present medications.                           - Await pathology results.                           - Repeat colonoscopy in 1 year for screening                            purposes. General anesthesia required (see above). Procedure Code(s):        --- Professional ---                           [phone], Colonoscopy, flexible; with removal of                            tumor(s), polyp(s), or other lesion(s) by snare                            technique Diagnosis Code(s):        --- Professional ---                           D12.2, Benign neoplasm of ascending colon  Z15.09, Genetic susceptibility to other malignant                            neoplasm CPT copyright 2018 [employer]. All rights reserved. The codes documented in this report are preliminary and upon coder review may  be revised to meet current compliance requirements. Khiree Bukhari L. Loletha Carrow, MD 10/07/2018 10:11:29 AM This report has been signed electronically. Number of Addenda: 0

## 2018-10-09 ENCOUNTER — Encounter (HOSPITAL_COMMUNITY): Payer: Self-pay | Admitting: Gastroenterology

## 2018-10-10 ENCOUNTER — Encounter: Payer: Self-pay | Admitting: Gastroenterology

## 2020-02-12 IMAGING — MR MR LUMBAR SPINE W/O CM
4 of 5 series · 25 of 48 positions shown · non-contrast
Comparison: Prior MRI from 11/30/2016.

CLINICAL DATA: Initial evaluation for low back pain for 1-2 years.

EXAM:
MRI LUMBAR SPINE WITHOUT CONTRAST
TECHNIQUE: Multiplanar, multisequence MR imaging of the lumbar spine was
performed. No intravenous contrast was administered.

[Series 3: T2 post-contrast · sagittal · 4.0mm · 0.57mm/px · 6 of 15 slices shown]
[im 1/15]
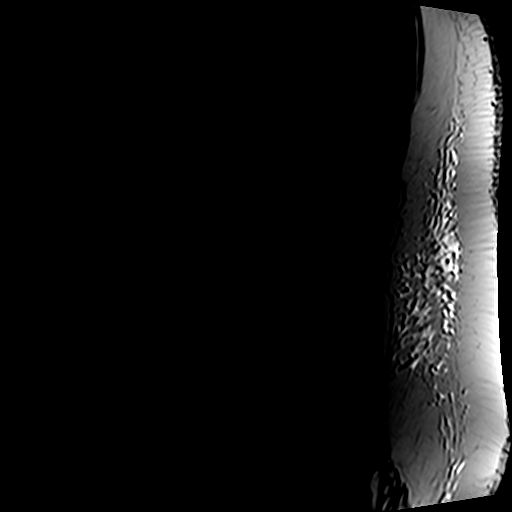
[im 3/15]
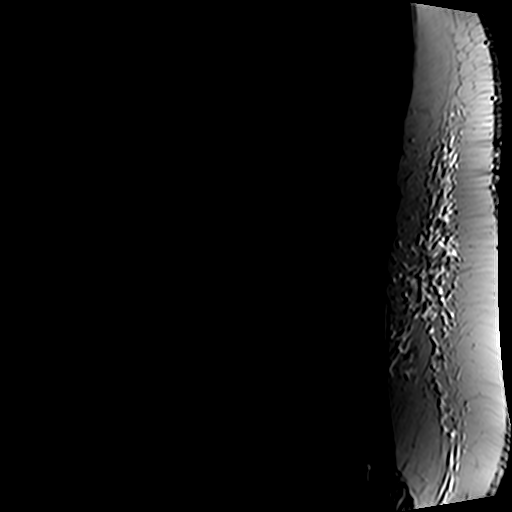
[im 6/15]
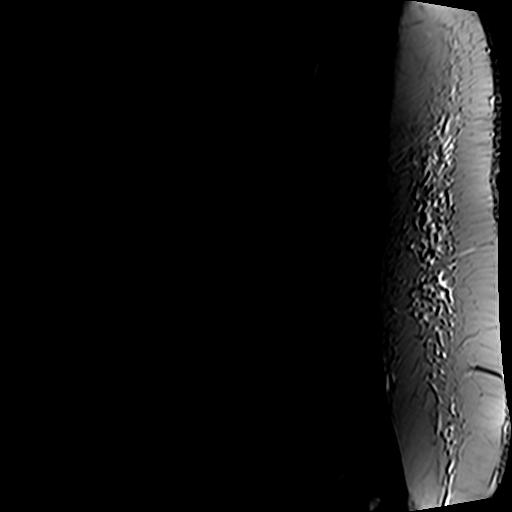
[im 9/15]
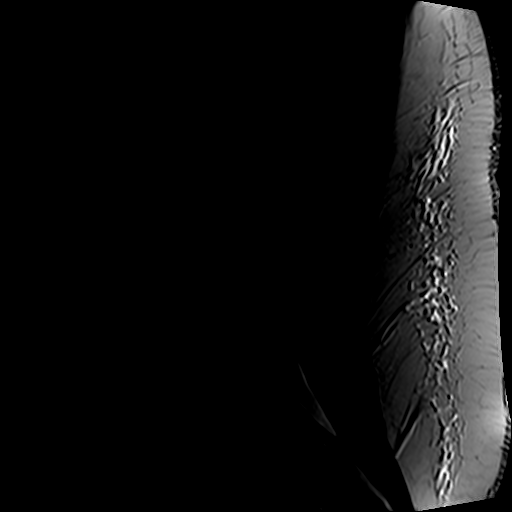
[im 12/15]
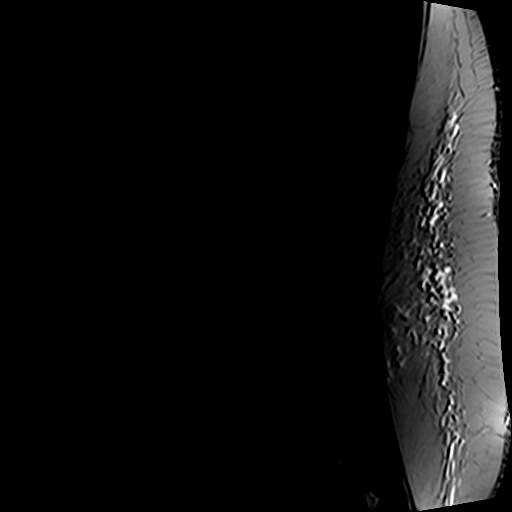
[im 15/15]
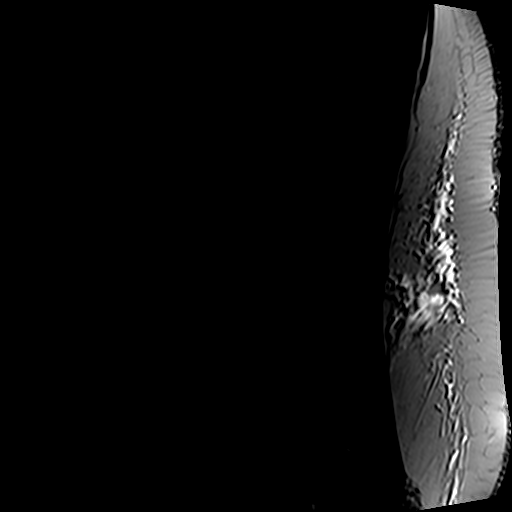

[Series 5: T1 · sagittal · 4.0mm · 0.57mm/px · 6 of 15 slices shown (1 of 2)]
[im 1/15]
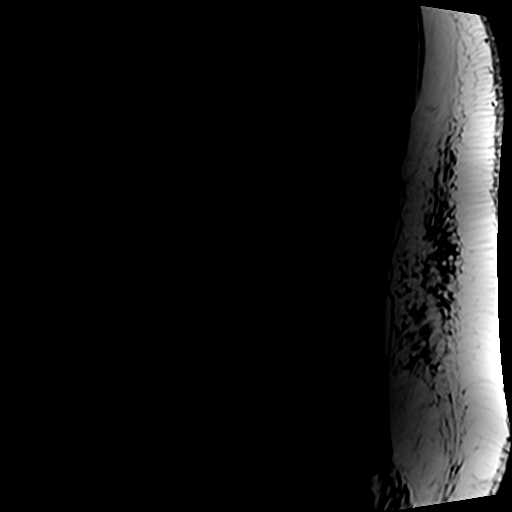
[im 3/15]
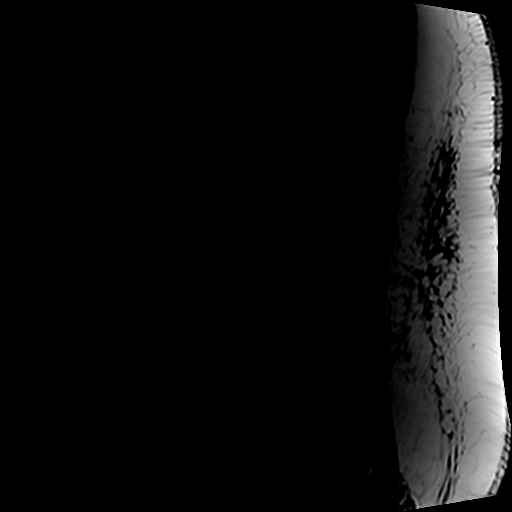
[im 6/15]
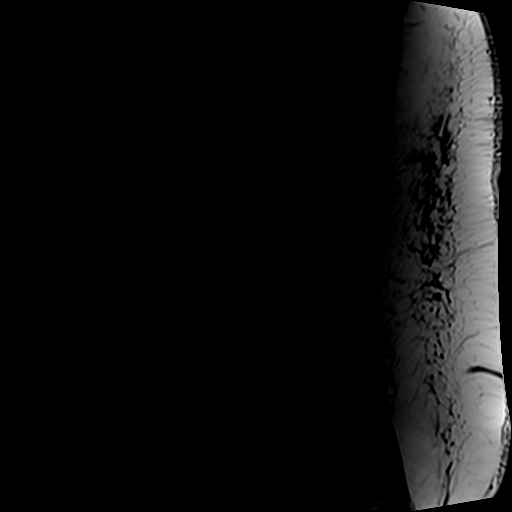
[im 9/15]
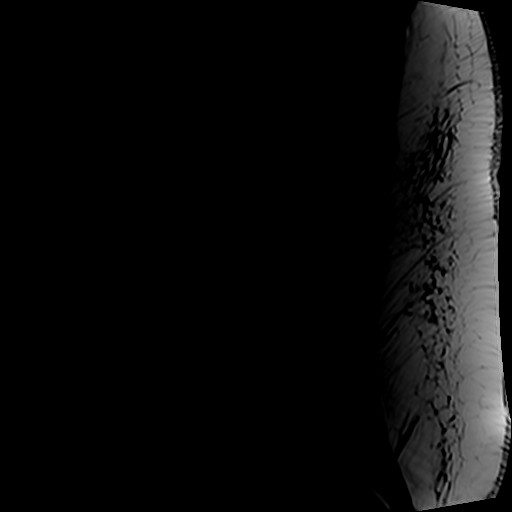
[im 12/15]
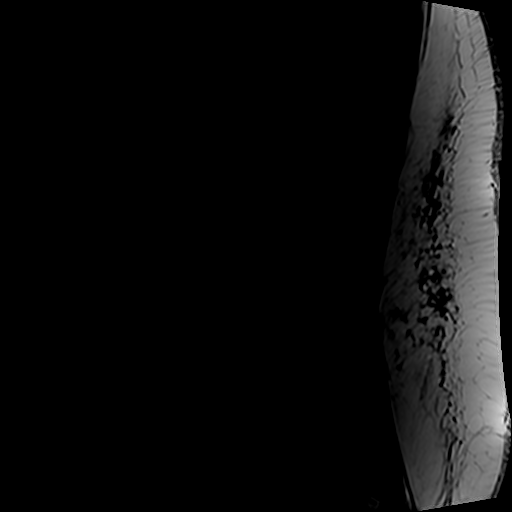
[im 15/15]
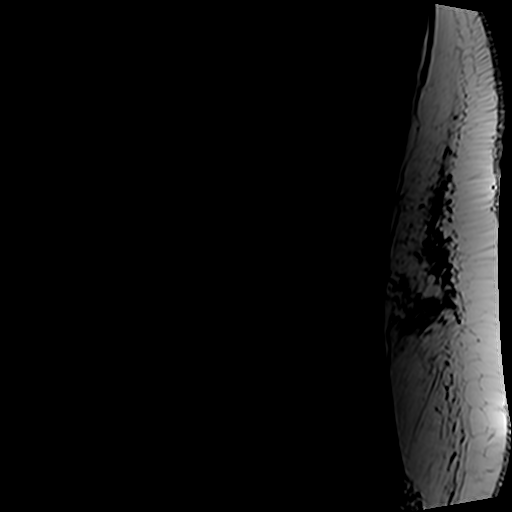

[Series 6: T1 · axial · 4.0mm · 0.35mm/px · z∈[-184,+10]mm · 4 of 41 slices shown (2 of 2)]
[im 1/41]
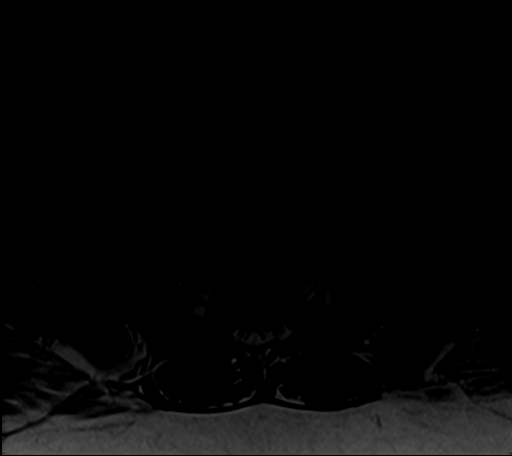
[im 6/41]
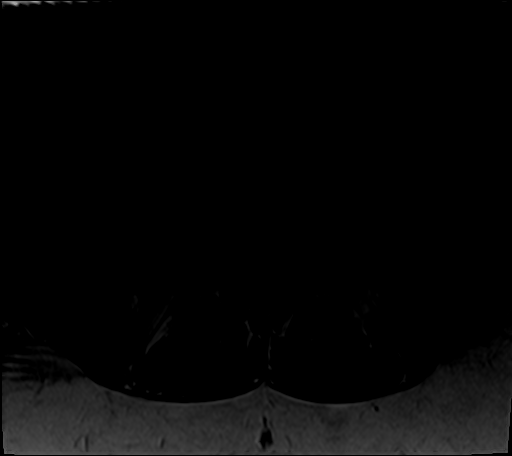
[im 21/41]
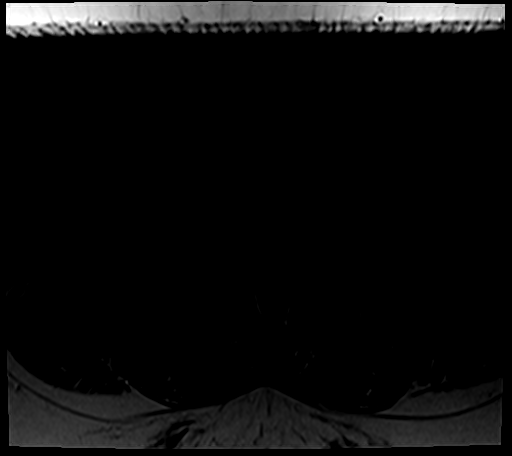
[im 35/41]
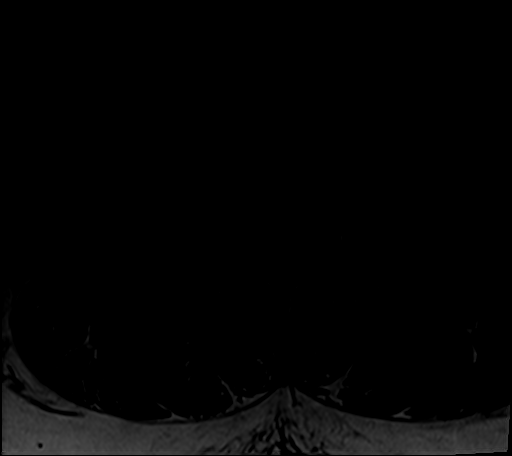

[Series 7: T2 · axial · 4.0mm · 0.70mm/px · z∈[-184,+42]mm · 9 of 41 slices shown]
[im 1/41]
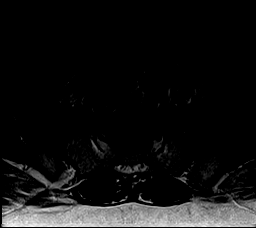
[im 6/41]
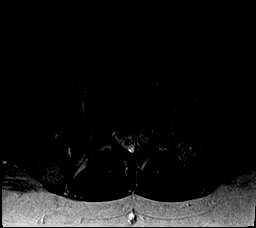
[im 12/41]
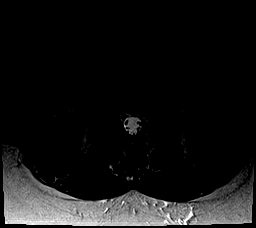
[im 18/41]
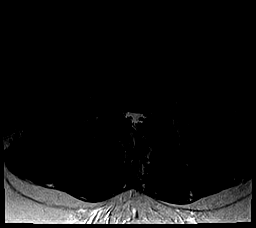
[im 21/41]
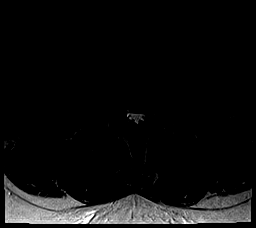
[im 23/41]
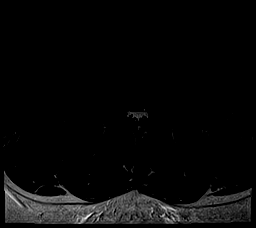
[im 29/41]
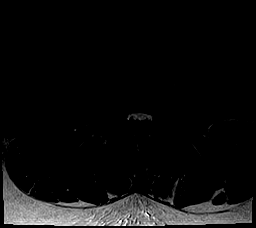
[im 35/41]
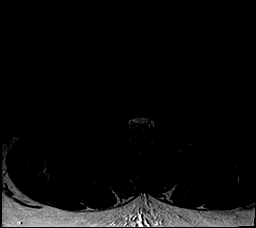
[im 41/41]
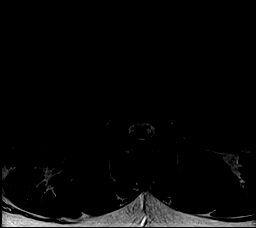

[25 of 48 positions shown; findings below may reference images not displayed]

FINDINGS: Segmentation: Transitional lumbosacral anatomy with lumbarization of
the S1 segment. Same numbering system is employed as on previous
exam.

Alignment: Mild scoliosis. Trace retrolisthesis of L5 on S1, stable.
Vertebral bodies otherwise normally aligned.

Vertebrae: Vertebral body heights maintained without evidence for
acute or interval fracture. Bone marrow signal intensity within
normal limits. No discrete or worrisome osseous lesions. Minimal
reactive endplate changes present about the L4-5 interspace.

Conus medullaris and cauda equina: Conus extends to the L1-2 level.
Conus and cauda equina appear normal.

Paraspinal and other soft tissues: Paraspinous soft tissues within
normal limits. Visualized visceral structures unremarkable.

Disc levels:

L1-2:  Unremarkable.

L2-3:  Unremarkable.

L3-4:  Unremarkable.

L4-5: Mild diffuse disc bulge with disc desiccation. There has been
interval regression of previously seen moderately large central disc
protrusion. Residual shallow posterior disc protrusion indents the
ventral thecal sac. Persistent mild left lateral recess narrowing,
with the protruding disc contacting the descending left L5 nerve
root (series 7, image 24). Mild facet ligament flavum hypertrophy.
Improved left lateral recess stenosis, now mild in nature. Central
canal remains patent. Foramina remain patent.

L5-S1: Disc desiccation with shallow posterior disc bulge. Subtle
soft tissue density at the level of the inferior left lateral
recess/neural foramen suspicious for a small disc protrusion (series
6, image 30). This contacts the descending left S1 nerve root,
similar to previous. Mild to moderate facet hypertrophy with trace
bilateral joint effusions. Mild to moderate left lateral recess
narrowing. Mild bilateral foraminal stenosis is relatively stable.

S1-2: Unremarkable.
IMPRESSION: 1. Partial interval regression of previously seen central disc
protrusion at L4-5 with improved mild left lateral recess stenosis.
Residual small central disc protrusion contacts and potentially
irritates the descending left L5 nerve root.
2. Disc bulge with left subarticular disc protrusion at L5-S1,
potentially affecting the descending S1 nerve root, similar to
previous.
3. Transitional lumbosacral anatomy.

## 2022-05-18 ENCOUNTER — Ambulatory Visit: Payer: BLUE CROSS/BLUE SHIELD | Admitting: Gastroenterology
